# Patient Record
Sex: Male | Born: 1985
Health system: Southern US, Community
[De-identification: ages and names within clinical notes are randomized; demographics above are authoritative.]

## PROBLEM LIST (undated history)

## (undated) DIAGNOSIS — IMO0001 Reserved for inherently not codable concepts without codable children: Secondary | ICD-10-CM

## (undated) DIAGNOSIS — S8290XA Unspecified fracture of unspecified lower leg, initial encounter for closed fracture: Secondary | ICD-10-CM

## (undated) HISTORY — PX: OTHER SURGICAL HISTORY: SHX169

## (undated) HISTORY — DX: Reserved for inherently not codable concepts without codable children: IMO0001

## (undated) HISTORY — DX: Unspecified fracture of unspecified lower leg, initial encounter for closed fracture: S82.90XA

---

## 2014-07-10 LAB — LIPID PANEL
CHOLESTEROL: 130 mg/dL (ref 0–200)
HDL: 68 mg/dL (ref 35–70)

## 2014-07-10 LAB — BASIC METABOLIC PANEL: GLUCOSE: 77 mg/dL

## 2014-08-28 ENCOUNTER — Ambulatory Visit (INDEPENDENT_AMBULATORY_CARE_PROVIDER_SITE_OTHER): Payer: BLUE CROSS/BLUE SHIELD | Admitting: Family Medicine

## 2014-08-28 ENCOUNTER — Encounter: Payer: Self-pay | Admitting: Family Medicine

## 2014-08-28 VITALS — BP 128/88 | Temp 99.5°F | Ht 76.0 in | Wt 198.0 lb

## 2014-08-28 DIAGNOSIS — Z0000 Encounter for general adult medical examination without abnormal findings: Secondary | ICD-10-CM

## 2014-08-28 DIAGNOSIS — Z23 Encounter for immunization: Secondary | ICD-10-CM

## 2014-08-28 DIAGNOSIS — Z808 Family history of malignant neoplasm of other organs or systems: Secondary | ICD-10-CM | POA: Insufficient documentation

## 2014-08-28 LAB — CBC
HCT: 45.7 % (ref 39.0–52.0)
Hemoglobin: 15.3 g/dL (ref 13.0–17.0)
MCHC: 33.4 g/dL (ref 30.0–36.0)
MCV: 95.4 fl (ref 78.0–100.0)
PLATELETS: 212 10*3/uL (ref 150.0–400.0)
RBC: 4.78 Mil/uL (ref 4.22–5.81)
RDW: 13.9 % (ref 11.5–15.5)
WBC: 6 10*3/uL (ref 4.0–10.5)

## 2014-08-28 LAB — COMPREHENSIVE METABOLIC PANEL
ALBUMIN: 4.8 g/dL (ref 3.5–5.2)
ALK PHOS: 29 U/L — AB (ref 39–117)
ALT: 12 U/L (ref 0–53)
AST: 16 U/L (ref 0–37)
BILIRUBIN TOTAL: 0.8 mg/dL (ref 0.2–1.2)
BUN: 9 mg/dL (ref 6–23)
CALCIUM: 9.8 mg/dL (ref 8.4–10.5)
CO2: 32 mEq/L (ref 19–32)
CREATININE: 0.81 mg/dL (ref 0.40–1.50)
Chloride: 104 mEq/L (ref 96–112)
GFR: 118.61 mL/min (ref >60.00)
Glucose, Bld: 96 mg/dL (ref 70–99)
Potassium: 4.4 mEq/L (ref 3.5–5.1)
SODIUM: 140 meq/L (ref 135–145)
TOTAL PROTEIN: 7.3 g/dL (ref 6.0–8.3)

## 2014-08-28 LAB — POCT URINALYSIS DIPSTICK
BILIRUBIN UA: NEGATIVE
Glucose, UA: NEGATIVE
KETONES UA: NEGATIVE
LEUKOCYTES UA: NEGATIVE
Nitrite, UA: NEGATIVE
PH UA: 8.5
Protein, UA: NEGATIVE
RBC UA: NEGATIVE
SPEC GRAV UA: 1.015
Urobilinogen, UA: 0.2

## 2014-08-28 LAB — TSH: TSH: 1.43 u[IU]/mL (ref 0.35–4.50)

## 2014-08-28 NOTE — Progress Notes (Signed)
Tana Conch, MD Phone: [phone]  Subjective:  Patient presents today to establish care. Yearly physical through work but no other care.  Chief complaint-noted.   70 week old at home. No immunizations other than flu shot at work. Needs Tdap.   BP at work 110/70. Waist circumference <35. Initially high here but resolved on repeat  Chol (lipids all normal range) and blood sugar (77).   Skin surgery center once a year full skin exam- melanoma in mother x 2. For 3 years.   Exercise regularly. Used to be 8 hours of sleep.   Eats pretty clean.   The following were reviewed and entered/updated in epic: Past Medical History  Diagnosis Date  . Healthy adult   . Leg fracture     L- age 82   Patient Active Problem List   Diagnosis Date Noted  . Family history of melanoma 08/28/2014    Priority: Low   Past Surgical History  Procedure Laterality Date  . None      Family History  Problem Relation Age of Onset  . Melanoma Mother     x2    Medications- reviewed and updated. No meds.   Allergies-reviewed and updated No Known Allergies  Social History   Social History  . Marital Status: Married    Spouse Name: N/A  . Number of Children: N/A  . Years of Education: N/A   Social History Main Topics  . Smoking status: Never Smoker   . Smokeless tobacco: Not on file  . Alcohol Use: 7.2 oz/week    12 Standard drinks or equivalent per week  . Drug Use: No  . Sexual Activity: Not on file   Other Topics Concern  . Not on file   Social History Narrative   Family: Married. 08/02/14 son Simonne Come.       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 Oropharynx normal. TM normal. Eyes: sclera and lids normal, PERRLA Neck: no thyromegaly, no cervical lymphadenopathy CV: RRR no murmurs rubs or gallops Lungs: CTAB no crackles, wheeze, rhonchi Abdomen: soft/nontender/nondistended/normal bowel sounds. No rebound or guarding.  Ext: no edema, 2+ PT pulses Skin: warm, dry, no rash, fair skin, many freckles Neuro: 5/5 strength in upper and lower extremities, normal gait, normal reflexes   Assessment/Plan:  29 y.o. male presenting for annual physical.  Health Maintenance counseling: 1. Anticipatory guidance: Patient counseled regarding regular dental exams, wearing seatbelts, wear sunscreen. Dermatology yearly due to family history. 2. Risk factor reduction:  Advised patient of need for regular exercise and diet rich and fruits and vegetables to reduce risk of heart attack and stroke.  3. Immunizations/screenings/ancillary studies Health Maintenance Due  Topic Date Due  . HIV Screening - monogamous - 1 partner only ever 04/06/1999  . TETANUS/TDAP - today 04/06/2003  . INFLUENZA VACCINE - through work 08/13/2014  4. Testicular cancer- recommend monthly self exams  Hep A if travels  nonfasting- no concerns with completed labs, already  had lipids through work and fasting CBG Results for orders placed or performed in visit on 08/28/14 (from the past 24 hour(s))  CBC     Status: None   Collection Time: 08/28/14 12:28 PM  Result Value Ref Range   WBC 6.0 4.0 - 10.5 K/uL   RBC 4.78 4.22 - 5.81 Mil/uL   Platelets 212.0 150.0 - 400.0 K/uL   Hemoglobin 15.3 13.0 - 17.0 g/dL   HCT 16.1 09.6 - 04.5 %   MCV 95.4 78.0 - 100.0 fl   MCHC 33.4 30.0 - 36.0 g/dL   RDW 40.9 81.1 - 91.4 %  Comprehensive metabolic panel     Status: Abnormal   Collection Time: 08/28/14 12:28 PM  Result Value Ref Range   Sodium 140  135 - 145 mEq/L   Potassium 4.4 3.5 - 5.1 mEq/L   Chloride 104 96 - 112 mEq/L   CO2 32 19 - 32 mEq/L   Glucose, Bld 96 70 - 99 mg/dL   BUN 9 6 - 23 mg/dL   Creatinine, Ser 7.82 0.40 - 1.50 mg/dL   Total Bilirubin 0.8 0.2 - 1.2 mg/dL   Alkaline Phosphatase 29 (L) 39 - 117 U/L   AST 16 0 - 37 U/L   ALT 12 0 - 53 U/L   Total Protein 7.3 6.0 - 8.3 g/dL   Albumin 4.8 3.5 - 5.2 g/dL   Calcium 9.8 8.4 - 95.6 mg/dL   GFR 213.08 >65.78 mL/min  TSH     Status: None   Collection Time: 08/28/14 12:28 PM  Result Value Ref Range   TSH 1.43 0.35 - 4.50 uIU/mL  POCT urinalysis dipstick     Status: None   Collection Time: 08/28/14  2:07 PM  Result Value Ref Range   Color, UA yellow    Clarity, UA clear    Glucose, UA n    Bilirubin, UA n    Ketones, UA n    Spec Grav, UA 1.015    Blood, UA n    pH, UA 8.5    Protein, UA n    Urobilinogen, UA 0.2    Nitrite, UA n    Leukocytes, UA Negative Negative   Orders Placed This Encounter  Procedures  . Tdap vaccine greater than or equal to 7yo IM

## 2014-08-28 NOTE — Patient Instructions (Signed)
Medication Instructions:  None needed  Other Instructions:  Great job taking care of yourself!   Labwork: In lab on right on your way out  Testing/Procedures/Immunizations: Tdap today (coveres tetanus and pertussis-whooping cough)  Would need Hep A for certain travel destinations  Follow-Up (all visit scheduling, rescheduling, cancellations including labs should be scheduled at front desk): 1 year for physical  Travel medicine [phone] if going out of country

## 2014-12-24 ENCOUNTER — Encounter: Payer: Self-pay | Admitting: Family Medicine

## 2014-12-24 ENCOUNTER — Ambulatory Visit (INDEPENDENT_AMBULATORY_CARE_PROVIDER_SITE_OTHER): Payer: BLUE CROSS/BLUE SHIELD | Admitting: Family Medicine

## 2014-12-24 VITALS — BP 122/80 | Temp 98.6°F | Wt 204.0 lb

## 2014-12-24 DIAGNOSIS — L03011 Cellulitis of right finger: Secondary | ICD-10-CM | POA: Diagnosis not present

## 2014-12-24 MED ORDER — AMOXICILLIN-POT CLAVULANATE 875-125 MG PO TABS: 1.0000 | ORAL_TABLET | Freq: Two times a day (BID) | ORAL | Status: DC

## 2014-12-24 NOTE — Progress Notes (Signed)
Dave ConchStephen Tanelle Lanzo, MD  Subjective:  Dave Pena is a 29 y.o. year old very pleasant male patient who presents for/with See problem oriented charting ROS- no fever, chills. Does have expanding redness and swelling on right hand pointer finger. No nausea/vomiting  Past Medical History-  Patient Active Problem List   Diagnosis Date Noted  . Family history of melanoma 08/28/2014    Priority: Low    Medications- reviewed and updated, none  Objective: BP 122/80 mmHg  Temp(Src) 98.6 F (37 C)  Wt 204 lb (92.534 kg) Gen: NAD, resting comfortably CV: RRR no murmurs rubs or gallops Lungs: CTAB no crackles, wheeze, rhonchi Ext: no edema except localized to hand On palmar side of pointer finger 1 cm puncture wound now healing. Finger is significantly swollen and noted to have erythema that extends onto other side of hand as well as past 2nd MCP joint. Picture noted below  Skin: warm, dry, no rash or erythema except on right hand Neuro: intact distal sensation. 4/5 strength with flexing pointer finger due to pain       Assessment/Plan:  Right hand Cellulitis  S: Patient was playing with his dog on Friday night and they both went running for a ball and his hand and dog's teeth got to ball about the same time. Puncture wound was created on palmar side of 2nd finger. Patient states that it was swollen and mildly painful next day but what concerned him was continued progression of redness and swelling through Sunday and now today. He is up to date on tetanus and had this 08/28/14. Dog is up to date on immunizations and in fact dog showed no aggression and was "apologetic" essentially after incident A/P: Appears to have had decent puncture here. May have some tendon damage from puncture in flexors but likely weakness just due to swelling. No concern for fracture. For cellulitis from dog tooth- will use augmentin for 7 days with Return precautions advised.   Meds ordered this encounter    Medications  . amoxicillin-clavulanate (AUGMENTIN) 875-125 MG tablet    Sig: Take 1 tablet by mouth 2 (two) times daily.    Dispense:  14 tablet    Refill:  0

## 2014-12-24 NOTE — Patient Instructions (Signed)
Cellulitis (skin infection) due to dog bite Treat with augmentin for 7 days  Elevate, ice area for swelling portion  Follow up if fevers, expanding redness, no improvement with course of antibiotics

## 2015-06-20 ENCOUNTER — Ambulatory Visit (INDEPENDENT_AMBULATORY_CARE_PROVIDER_SITE_OTHER): Payer: BLUE CROSS/BLUE SHIELD | Admitting: Family Medicine

## 2015-06-20 ENCOUNTER — Encounter: Payer: Self-pay | Admitting: Family Medicine

## 2015-06-20 VITALS — BP 116/88 | HR 83 | Temp 98.1°F | Ht 76.0 in | Wt 200.0 lb

## 2015-06-20 DIAGNOSIS — R42 Dizziness and giddiness: Secondary | ICD-10-CM

## 2015-06-20 DIAGNOSIS — R0789 Other chest pain: Secondary | ICD-10-CM

## 2015-06-20 DIAGNOSIS — K219 Gastro-esophageal reflux disease without esophagitis: Secondary | ICD-10-CM | POA: Diagnosis not present

## 2015-06-20 NOTE — Progress Notes (Signed)
Pre visit review using our clinic review tool, if applicable. No additional management support is needed unless otherwise documented below in the visit note. 

## 2015-06-20 NOTE — Progress Notes (Signed)
Subjective:  Dave Pena is a 30 y.o. year old very pleasant male patient who presents for/with See problem oriented charting ROS- see any ROS included in HPI as well.   Past Medical History- nonsmoker Patient Active Problem List   Diagnosis Date Noted  . Family history of melanoma 08/28/2014    Priority: Low   Medications- reviewed and updated, no medications  Objective: BP 116/88 mmHg  Pulse 83  Temp(Src) 98.1 F (36.7 C) (Oral)  Ht 6\' 4"  (1.93 m)  Wt 200 lb (90.719 kg)  BMI 24.35 kg/m2  SpO2 98% Gen: NAD, resting comfortably CV: RRR no murmurs rubs or gallops Lungs: CTAB no crackles, wheeze, rhonchi Abdomen: soft/nontender/nondistended/normal bowel sounds.  Ext: no edema Skin: warm, dry Neuro: CN II-XII intact, sensation and reflexes normal throughout, 5/5 muscle strength in bilateral upper and lower extremities. Normal finger to nose. Normal rapid alternating movements. No pronator drift. Normal romberg. Normal gait.   Assessment/Plan:  Chest tightness Dizziness S:since Saturday has been feeling "off". Feels a "faint tightness" in chest. Central chest- states not painful- "just feels off". Very gassy and having deep burps. Normal day today but then at work felt lightheaded. Deep breaths do not make a difference. Episode at work lasted about an hour 10am to 11 am today. Went out to walk and felt worse. Went home and rested, ate some food made it better.   Month of June decided not to drink any alcohol. Drinking carobonated water in place.  Has 6811 month old and [place] very soon. Work has been ok.   ROS- no shortness of breath, diaphoresis, left arm or neck pain. No dizziness until today. No facial or extremity weakness. No slurred words or trouble swallowing. no blurry vision or double vision. No paresthesias. No confusion or word finding difficulties.   A/P: 30 year old active, athletic male  With no cardiac risk factors with  no family history of cardiac or neurological issues presents with chest tightness for several days associated with gas and increase in carbonated beverages as well as dizzy episode for an hour at work earlier today. Discussed likelihood of cardiac cause low- offered EKG which was declined. Discussed likely reflux trigger with increased carbonated beverages and gassiness- he is going to reduce drinks, try tums. If not work- try PPI for a few weeks. If worsening symptoms return to care. Reassuring neuro exam in regards to dizziness. Doubt arhythmia for this. Suspect both issues may be worsened by recent stressor of having wife leave to go [place] for 2.5 weeks while caring for his 6111 month old son.   Strict Return precautions advised.   The duration of face-to-face time during this visit was 20 minutes. Greater than 50% of this time was spent in counseling, explanation of diagnosis, planning of further management, and/or coordination of care.    Tana ConchStephen Rydell Wiegel, MD

## 2015-06-20 NOTE — Patient Instructions (Signed)
Chest tightness/gassiness- think this has high probability of being reflux related. Would hold off on carbonated beverages and trial Tums when bothering you. If no better in a week, trial OTC prilosec 20mg .   For dizziness- I wonder if this could be stress related given preceeding chest tightness issues. Your neurological exam was reassuring and your heart had a nice regular rate and rhythm so my suspicion for arhythmia is very low  We considered EKG for both but ultimately opted against this with reassuring neurological, cardiac exam and overall healthy history and healthy family history. You will return for new or worsening or persistent symptoms and we will further investigate.

## 2016-06-22 ENCOUNTER — Encounter: Payer: Self-pay | Admitting: Family Medicine

## 2016-06-22 ENCOUNTER — Ambulatory Visit (INDEPENDENT_AMBULATORY_CARE_PROVIDER_SITE_OTHER): Payer: BLUE CROSS/BLUE SHIELD | Admitting: Family Medicine

## 2016-06-22 DIAGNOSIS — Z91038 Other insect allergy status: Secondary | ICD-10-CM | POA: Insufficient documentation

## 2016-06-22 MED ORDER — EPINEPHRINE 0.3 MG/0.3ML IJ SOAJ: 0.3000 mg | Freq: Once | INTRAMUSCULAR | 1 refills | Status: AC

## 2016-06-22 NOTE — Patient Instructions (Addendum)
Significant allergic reaction to insect bite/sting (unknown insect)  Suspect calf pain will slowly resolve within a week or two. No signs of bacterial infection but watch out for worsening pain, expanding redness near site. Glad you are up to date on tetanus shot already Immunization History  Administered Date(s) Administered  . Influenza-Unspecified 11/27/2014  . Tdap 08/28/2014   I want you to have epipen on hand in case you do not have quick response to benadryl if ever have similar reactoin. Would also get some adult benadryl on hand for home.

## 2016-06-22 NOTE — Assessment & Plan Note (Signed)
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 Felt pain in left calf after getting a pillow out from porch- sharp pain back of left calf and swatted insect away - but didn't see specific insect. History of bee stings before and no reaction.   Within an hour-Noted bottom lip swelling. Rash on back arms and hips. Took 3 teaspoons of childrens benadryl around 12. Saw urgent care around 1 PM- but never was seen because things were improving and he wen thome  Mild calf pain this AM- improving as day has gone in.  A/P: Lip swelling after insect bite or sting (unknown insect). Good response to benadryl- advised him to have adult benadryl on hand. Given angioedema- will have him keep an epi pen on hand (sent to pharmacy).   He asks about possible bacterial infection but no pain with palpation of calf and no visible issue on skin- advised return precautions only but would not advise antibiotic at this time

## 2016-06-22 NOTE — Progress Notes (Signed)
Subjective:  Dave Pena is a 31 y.o. year old very pleasant male patient who presents for/with See problem oriented charting ROS- No chest pain or shortness of breath. No headache or blurry vision. Did have lip swelling- but as noted never short of breath   Past Medical History-  Patient Active Problem List   Diagnosis Date Noted  . Family history of melanoma 08/28/2014    Priority: Low  . Allergic to insect stings 06/22/2016    Medications- reviewed and updated, none  Objective: BP 122/82 (BP Location: Left Arm, Patient Position: Sitting, Cuff Size: Large)   Pulse 81   Temp 98.2 F (36.8 C) (Oral)   Ht 6\' 4"  (1.93 m)   Wt 204 lb 12.8 oz (92.9 kg)   SpO2 99%   BMI 24.93 kg/m  Gen: NAD, resting comfortably CV: RRR Lungs: CTAB no crackles, wheeze, rhonchi  Ext: no edema, left calf without pain to palpation- no skin chnages Skin: warm, dry, no rash  Assessment/Plan:  Allergic to insect stings S:  doing outdoor work yesterday. Felt pain in left calf after getting a pillow out from porch- sharp pain back of left calf and swatted insect away - but didn't see specific insect. History of bee stings before and no reaction.   Within an hour-Noted bottom lip swelling. Rash on back arms and hips. Took 3 teaspoons of childrens benadryl around 12. Saw urgent care around 1 PM- but never was seen because things were improving and he wen thome  Mild calf pain this AM- improving as day has gone in.  A/P: Lip swelling after insect bite or sting (unknown insect). Good response to benadryl- advised him to have adult benadryl on hand. Given angioedema- will have him keep an epi pen on hand (sent to pharmacy).   He asks about possible bacterial infection but no pain with palpation of calf and no visible issue on skin- advised return precautions only but would not advise antibiotic at this time   Meds ordered this encounter  Medications  . EPINEPHrine (EPIPEN 2-PAK) 0.3 mg/0.3 mL IJ SOAJ  injection    Sig: Inject 0.3 mLs (0.3 mg total) into the muscle once.    Dispense:  2 Device    Refill:  1    Return precautions advised.  Prn follow up for this acute issue Tana ConchStephen Kyree Fedorko, MD

## 2016-11-20 ENCOUNTER — Ambulatory Visit: Payer: BLUE CROSS/BLUE SHIELD | Admitting: Sports Medicine

## 2017-02-19 ENCOUNTER — Ambulatory Visit: Payer: BLUE CROSS/BLUE SHIELD | Admitting: Sports Medicine

## 2017-02-19 ENCOUNTER — Encounter: Payer: Self-pay | Admitting: Sports Medicine

## 2017-02-19 ENCOUNTER — Ambulatory Visit (INDEPENDENT_AMBULATORY_CARE_PROVIDER_SITE_OTHER): Payer: BLUE CROSS/BLUE SHIELD

## 2017-02-19 VITALS — BP 120/80 | HR 81 | Ht 76.0 in | Wt 207.2 lb

## 2017-02-19 DIAGNOSIS — S8992XA Unspecified injury of left lower leg, initial encounter: Secondary | ICD-10-CM | POA: Diagnosis not present

## 2017-02-19 DIAGNOSIS — R29898 Other symptoms and signs involving the musculoskeletal system: Secondary | ICD-10-CM

## 2017-02-19 DIAGNOSIS — M25562 Pain in left knee: Secondary | ICD-10-CM | POA: Diagnosis not present

## 2017-02-19 DIAGNOSIS — K219 Gastro-esophageal reflux disease without esophagitis: Secondary | ICD-10-CM | POA: Diagnosis not present

## 2017-02-19 MED ORDER — IBUPROFEN-FAMOTIDINE 800-26.6 MG PO TABS: 1.0000 | ORAL_TABLET | Freq: Three times a day (TID) | ORAL | 0 refills | Status: DC | PRN

## 2017-02-19 MED ORDER — IBUPROFEN-FAMOTIDINE 800-26.6 MG PO TABS: 1.0000 | ORAL_TABLET | Freq: Three times a day (TID) | ORAL | 2 refills | Status: DC | PRN

## 2017-02-19 NOTE — Progress Notes (Signed)
[facility]. [facility] [phone]  DANTHONY KENDRIX - 32 y.o. male MRN [id number]  Date of birth: 05/24/1985  Visit Date: 02/19/2017  PCP: Shelva Majestic, MD   Referred by: Shelva Majestic, MD   Scribe for today's visit: Stevenson Clinch, CMA     SUBJECTIVE:  Dave Pena is here for New Patient (Initial Visit) (LT knee pain)  His LT knee pain symptoms INITIALLY: Began in August and started after going on a trail run.  Described as mild stiffness/tightness, nonradiating Worsened with walking after sitting for prolonged periods of time. Pain is also worse after running.  Improved with activity. Additional associated symptoms include: Pain is on the medial aspect of the knee. He has not noticed any swelling. He denies clicking or popping in the knee. He denies pain in ankles, feet hips.     At this time symptoms are worsening compared to onset  He has tried icing and elevating his knee with some relief. He has taken IBU with some relief.    ROS Denies night time disturbances. Denies fevers, chills, or night sweats. Denies unexplained weight loss. Denies personal history of cancer. Denies changes in bowel or bladder habits. Denies recent unreported falls. Denies new or worsening dyspnea or wheezing. Denies lower extremity edema     HISTORY & PERTINENT PRIOR DATA:  Prior History reviewed and updated per electronic medical record.  Significant history, findings, studies and interim changes include:  reports that  has never smoked. he has never used smokeless tobacco. No results for input(s): HGBA1C, LABURIC, CREATINE in the last 8760 hours. No specialty comments available. No problems updated.  OBJECTIVE:  VS:  HT:6\' 4"  (193 cm)   WT:207 lb 3.2 oz (94 kg)  BMI:25.23    BP:120/80  HR:81bpm  TEMP: ( )  RESP:99 %   PHYSICAL EXAM: Constitutional: WDWN, Non-toxic appearing. Psychiatric: Alert &  appropriately interactive.  Not depressed or anxious appearing. Respiratory: No increased work of breathing.  Trachea Midline Eyes: Pupils are equal.  EOM intact without nystagmus.  No scleral icterus  NEUROVASCULAR exam: No clubbing or cyanosis appreciated No significant venous stasis changes Capillary Refill: normal, less than 2 seconds   Left knee is overall well aligned.  He has poor definition of his VMO on the left compared to the right.  Positive J sign.  Ligamentously stable.  No pain with McMurray's.  No effusion or synovitis.  No significant lower extremity edema.   No additional findings.   ASSESSMENT & PLAN:   1. Left knee pain, unspecified chronicity   2. Gastroesophageal reflux disease without esophagitis   3. Weakness of left hip    PLAN: Symptoms are consistent with functional knee pain with possibly small medial meniscus bulging.  Discussed the importance of appropriate biomechanics and improved VMO recruitment.  Hip abduction strengthening reviewed per procedure note as well.  Follow-up in 6 weeks to ensure clinical improvement.  No problem-specific Assessment & Plan notes found for this encounter.   ++++++++++++++++++++++++++++++++++++++++++++ Orders & Meds: Orders Placed This Encounter  Procedures  . DG Knee AP/LAT W/Sunrise Left    Meds ordered this encounter  Medications  . Ibuprofen-Famotidine (DUEXIS) 800-26.6 MG TABS    Sig: Take 1 tablet by mouth 3 (three) times daily as needed. 1 tab po tid X 14 days then 1 tab po tid as needed    Dispense:  90 tablet    Refill:  2  Home Phone      [phone] Mobile          [phone]   . Ibuprofen-Famotidine (DUEXIS) 800-26.6 MG TABS    Sig: Take 1 tablet by mouth 3 (three) times daily as needed.    Dispense:  9 tablet    Refill:  0    ++++++++++++++++++++++++++++++++++++++++++++ Follow-up: No Follow-up on file.   Pertinent documentation may be included in additional procedure notes, imaging studies,  problem based documentation and patient instructions. Please see these sections of the encounter for additional information regarding this visit. CMA/ATC served as Neurosurgeonscribe during this visit. History, Physical, and Plan performed by medical provider. Documentation and orders reviewed and attested to.      Andrena MewsMichael D Rigby, DO    [facility]

## 2017-02-19 NOTE — Procedures (Signed)
PROCEDURE NOTE: THERAPEUTIC EXERCISES (97110) 15 minutes spent for Therapeutic exercises as below and as referenced in the AVS. This included exercises focusing on stretching, strengthening, with significant focus on eccentric aspects.  Proper technique shown and discussed handout in great detail with ATC. All questions were discussed and answered.   Long term goals include an improvement in range of motion, strength, endurance as well as avoiding reinjury. Frequency of visits is one time as determined during today's  office visit. Frequency of exercises to be performed is as per handout.  EXERCISES REVIEWED:  Hip abduction exercises  VMO strengthening  Goodman exercises

## 2017-02-19 NOTE — Patient Instructions (Addendum)
Also check out [employer]"Foundation Training" which is a program developed by Dr. Myles LippsEric Goodman.   There are links to a couple of his YouTube Videos below and I would like to see performing one of his videos 5-6 days per week.    A good intro video is: "Independence from Pain 7-minute Video" - [email or web address]/[email or web address]   His more advanced video is: "Powerful Posture and Pain Relief: 12 minutes of Foundation Training" - https://youtu.be/[email or web address]  Do not try to attempt this entire video when first beginning.    Try breaking of each exercise that he goes into shorter segments.  Otherwise if they perform an exercise for 45 seconds, start with 15 seconds and rest and then resume when they begin the new activity.    If you work your way up to doing this 12 minute video, I expect you will see significant improvements in your pain.  If you enjoy his videos and would like to find out more you can look on his website: [email or web address].  He has a workout streaming option as well as a DVD set available for purchase.  Amazon has the best price for his DVDs.    Please perform the exercise program that we have prepared for you and gone over in detail on a daily basis.  In addition to the handout you were provided you can access your program through: [email or web address]   Your unique program code is: [email or web address]   [email or web address]   [facility] instructions for [facility], [facility] and Vimovo:  Your prescription will be filled through a mail order pharmacy.  It is typically [facility] but may vary depending on where you live.  You will receive a phone call from them which will typically come from a [phone] phone number.  You must speak directly to them to have this medication filled.  When the pharmacy calls, they will need your mailing address (for overnight shipment of the medication) andy they will need payment information if you have a copay (typically no more than $10). If you have not heard from them 2-3 days  after your appointment with Dr. Berline Choughigby, contact us at the office [phone]) or through MyChart so we can reach back out to the pharmacy.

## 2017-06-16 ENCOUNTER — Ambulatory Visit: Payer: BLUE CROSS/BLUE SHIELD | Admitting: Physician Assistant

## 2017-06-16 ENCOUNTER — Encounter: Payer: Self-pay | Admitting: Physician Assistant

## 2017-06-16 VITALS — BP 150/90 | HR 68 | Temp 98.2°F | Ht 76.0 in | Wt 202.5 lb

## 2017-06-16 DIAGNOSIS — B349 Viral infection, unspecified: Secondary | ICD-10-CM | POA: Diagnosis not present

## 2017-06-16 MED ORDER — AMOXICILLIN-POT CLAVULANATE 875-125 MG PO TABS: 1.0000 | ORAL_TABLET | Freq: Two times a day (BID) | ORAL | 0 refills | Status: DC

## 2017-06-16 NOTE — Patient Instructions (Signed)
It was great to see you!  You have a viral upper respiratory infection. Antibiotics are not needed for this.  Viral infections usually take 7-10 days to resolve.  The cough can last a few weeks to go away.  May alternate 500 mg ibuprofen with 200-400 mg tylenol every 6 hours. Push fluids. Consider taking daily antihistamine such as zyrtec, allegra, or claritin. Also consider using over the counter flonase -- once in am and pm.  Push fluids and get plenty of rest. Please return if you are not improving as expected, or if you have high fevers (>101.5) or difficulty swallowing or worsening productive cough.  Call clinic with questions.  I hope you start feeling better soon!

## 2017-06-16 NOTE — Progress Notes (Signed)
Dave Pena is a 32 y.o. male here for a new problem.  I acted as a Neurosurgeonscribe for Energy East CorporationSamantha Rosalynd Mcwright, PA-C Corky Mullonna Orphanos, LPN  History of Present Illness:   Chief Complaint  Patient presents with  . Sinus Problem    Sinus Problem  This is a new problem. Episode onset: Started on Monday. The problem has been gradually worsening since onset. There has been no fever. His pain is at a severity of 7/10. The pain is moderate. Associated symptoms include congestion (Nasal), coughing (at night), ear pain, headaches, neck pain, sinus pressure and a sore throat. Pertinent negatives include no chills, hoarse voice, shortness of breath, sneezing or swollen glands. Past treatments include saline sprays (Ibuprofen, Claritin yesterday). The treatment provided no relief.   Patient reports that his worst symptoms were yesterday, improved today.  He says that he feels fatigued and feels like he is dehydrated.  An episode of dizziness a few months ago right when the season change and symptoms resolved with rest.  Appetite is good.  No fevers.  Denies chest pain or shortness of breath.  Denies sick contacts, recent tick bites or travel.  Past Medical History:  Diagnosis Date  . Healthy adult   . Leg fracture    L- age 32     Social History   Socioeconomic History  . Marital status: Married    Spouse name: Not on file  . Number of children: Not on file  . Years of education: Not on file  . Highest education level: Not on file  Occupational History  . Not on file  Social Needs  . Financial resource strain: Not on file  . Food insecurity:    Worry: Not on file    Inability: Not on file  . Transportation needs:    Medical: Not on file    Non-medical: Not on file  Tobacco Use  . Smoking status: Never Smoker  . Smokeless tobacco: Never Used  Substance and Sexual Activity  . Alcohol use: Yes    Alcohol/week: 7.2 oz    Types: 12 Standard drinks or equivalent per week  . Drug use: No  . Sexual  activity: Not on file  Lifestyle  . Physical activity:    Days per week: Not on file    Minutes per session: Not on file  . Stress: Not on file  Relationships  . Social connections:    Talks on phone: Not on file    Gets together: Not on file    Attends religious service: Not on file    Active member of club or organization: Not on file    Attends meetings of clubs or organizations: Not on file    Relationship status: Not on file  . Intimate partner violence:    Fear of current or ex partner: Not on file    Emotionally abused: Not on file    Physically abused: Not on file    Forced sexual activity: Not on file  Other Topics Concern  . Not on file  Social History Narrative   Family: Married. 08/02/14 son Simonne ComeLeo.       Work: Engineer, manufacturing[employer]   Bachelors at [employer]   Started [employer]- played soccer there   [place]      Hobbies: active with soccer, runs with wife (up to 10 miles a week), mountain bike (about 10 miles a week)    Past Surgical History:  Procedure Laterality Date  . none  Family History  Problem Relation Age of Onset  . Melanoma Mother        x2    No Known Allergies  Current Medications:   Current Outpatient Medications:  .  ibuprofen (ADVIL,MOTRIN) 200 MG tablet, Take 400 mg by mouth as needed., Disp: , Rfl:  .  amoxicillin-clavulanate (AUGMENTIN) 875-125 MG tablet, Take 1 tablet by mouth 2 (two) times daily., Disp: 20 tablet, Rfl: 0   Review of Systems:   Review of Systems  Constitutional: Negative for chills.  HENT: Positive for congestion (Nasal), ear pain, sinus pressure and sore throat. Negative for hoarse voice and sneezing.   Respiratory: Positive for cough (at night). Negative for shortness of breath.   Musculoskeletal: Positive for neck pain.  Neurological: Positive for headaches.    Vitals:   Vitals:   06/16/17 1244  BP: (!) 150/90  Pulse: 68  Temp: 98.2 F (36.8 C)  TempSrc: Oral  SpO2: 97%  Weight: 202 lb 8 oz (91.9  kg)  Height: 6\' 4"  (1.93 m)     Body mass index is 24.65 kg/m.  Physical Exam:   Physical Exam  Constitutional: He appears well-developed. He is cooperative.  Non-toxic appearance. He does not have a sickly appearance. He does not appear ill. No distress.  HENT:  Head: Normocephalic and atraumatic.  Right Ear: External ear and ear canal normal.  Left Ear: External ear and ear canal normal.  Nose: Mucosal edema and rhinorrhea present. Right sinus exhibits maxillary sinus tenderness. Right sinus exhibits no frontal sinus tenderness. Left sinus exhibits maxillary sinus tenderness. Left sinus exhibits no frontal sinus tenderness.  Mouth/Throat: Uvula is midline and mucous membranes are normal. No posterior oropharyngeal edema or posterior oropharyngeal erythema. Tonsils are 1+ on the right. Tonsils are 1+ on the left. No tonsillar exudate.  Bilateral ears with cerumen impaction unable to visualize TMs  Eyes: Conjunctivae and lids are normal.  Neck: Trachea normal.  Cardiovascular: Normal rate, regular rhythm, S1 normal, S2 normal, normal heart sounds and normal pulses.  No LE edema  Pulmonary/Chest: Effort normal and breath sounds normal. He has no decreased breath sounds. He has no wheezes. He has no rhonchi. He has no rales.  Lymphadenopathy:    He has no cervical adenopathy.  Neurological: He is alert. He has normal strength. No cranial nerve deficit. GCS eye subscore is 4. GCS verbal subscore is 5. GCS motor subscore is 6.  Skin: Skin is warm, dry and intact.  Psychiatric: He has a normal mood and affect. His speech is normal and behavior is normal.  Nursing note and vitals reviewed.   Assessment and Plan:    Dave Pena was seen today for sinus problem.  Diagnoses and all orders for this visit:  Viral illness  Other orders -     amoxicillin-clavulanate (AUGMENTIN) 875-125 MG tablet; Take 1 tablet by mouth 2 (two) times daily.   No red flags on exam.  Will initiate supportive  care, including alternating ibuprofen and Tylenol, Flonase, and an antihistamine.  I did provide a pocket prescription of Augmentin should his symptoms not improve or if worsen; we briefly entertained the idea of steroids however patient would like to avoid this at this time.  We also discussed doing labs however he also declined this at this time and will let us know if his symptoms do not improve.  And I discussed with him that should his symptoms not improve or he has any new symptoms to follow-up with [place]. Reviewed return  precautions including worsening fever, SOB, worsening cough or other concerns. Push fluids and rest. I recommend that patient follow-up if symptoms worsen or persist despite treatment x 7-10 days, sooner if needed.   . Reviewed expectations re: course of current medical issues. . Discussed self-management of symptoms. . Outlined signs and symptoms indicating need for more acute intervention. . Patient verbalized understanding and all questions were answered. . See orders for this visit as documented in the electronic medical record. . Patient received an After-Visit Summary.  CMA or LPN served as scribe during this visit. History, Physical, and Plan performed by medical provider. Documentation and orders reviewed and attested to.   Jarold Motto, PA-C

## 2017-06-28 ENCOUNTER — Encounter: Payer: Self-pay | Admitting: Family Medicine

## 2017-06-28 ENCOUNTER — Ambulatory Visit: Payer: BLUE CROSS/BLUE SHIELD | Admitting: Family Medicine

## 2017-06-28 VITALS — BP 136/76 | HR 99 | Temp 97.8°F | Ht 76.0 in | Wt 199.0 lb

## 2017-06-28 DIAGNOSIS — R519 Headache, unspecified: Secondary | ICD-10-CM

## 2017-06-28 DIAGNOSIS — R51 Headache: Secondary | ICD-10-CM

## 2017-06-28 MED ORDER — KETOROLAC TROMETHAMINE 60 MG/2ML IM SOLN
60.0000 mg | Freq: Once | INTRAMUSCULAR | Status: AC
  Administered 2017-06-28: 60 mg via INTRAMUSCULAR

## 2017-06-28 MED ORDER — PREDNISONE 20 MG PO TABS: ORAL_TABLET | ORAL | 0 refills | Status: DC

## 2017-06-28 NOTE — Patient Instructions (Addendum)
Toradol 60mg - strong ibuprofen injection today  Start prednisone today  This would cover you for several things- inflammation in sinuses (you have been adequately treated for bacterial portion), migraine headaches, tension headaches.   See us back when you return if not feeling better- if it would give you some peace of mind could go ahead and schedule this before you leave for about 10 days  If you were to have worsening headaches or worsening dizziness/off balanced feelings see me back sooner.

## 2017-06-28 NOTE — Progress Notes (Signed)
Subjective:  Dave Pena is a 32 y.o. year old very pleasant male patient who presents for/with See problem oriented charting ROS-no chest pain or shortness of breath reported.  No blurry vision reported.  Does feel lightheaded at times.  No syncope.  Past Medical History-  Patient Active Problem List   Diagnosis Date Noted  . Family history of melanoma 08/28/2014    Priority: Low  . Allergic to insect stings 06/22/2016    Medications- reviewed and updated Current Outpatient Medications  Medication Sig Dispense Refill  . ibuprofen (ADVIL,MOTRIN) 200 MG tablet Take 400 mg by mouth as needed.    . predniSONE (DELTASONE) 20 MG tablet Take 2 pills for 3 days, 1 pill for 4 days 10 tablet 0   No current facility-administered medications for this visit.     Objective: BP 136/76 (BP Location: Left Arm, Patient Position: Sitting, Cuff Size: Large)   Pulse 99   Temp 97.8 F (36.6 C) (Oral)   Ht 6\' 4"  (1.93 m)   Wt 199 lb (90.3 kg)   SpO2 100%   BMI 24.22 kg/m  Gen: NAD, resting comfortably CV: RRR no murmurs rubs or gallops Lungs: CTAB no crackles, wheeze, rhonchi Abdomen: soft/nontender/nondistended/normal bowel sounds. No rebound or guarding.  Ext: no edema Skin: warm, dry Neuro: CN II-XII intact, sensation and reflexes normal throughout, 5/5 muscle strength in bilateral upper and lower extremities. Normal finger to nose. Normal rapid alternating movements. No pronator drift. Normal romberg. Normal gait.   Assessment/Plan:   New onset headache - Plan: CBC with Differential/Platelet, Comprehensive metabolic panel, ketorolac (TORADOL) injection 60 mg S: came in June 5th with sinus pressure and felt congested. A few weeks prior to that had some significant allergies- felt better next day. June 3rd had a bad headache- wondered if it was low blood sugar- soda didn't help. Lasted into next day and then came in on 5th.   He has now had a headache for full 2 weeks. Pain 7-8/10 can  get up to a 9. Pressure is behind both eyes and wont go away. Feels the worst when he wakes up in AM though not waking him from sleep. Slightly low appetite- still able to eat. Feels somewhat unstable- Concerned he could pass out - feels lightheaded but denies feeling imminently like he would pass out. Denies nausea- does have some tightness in neck and shoulders  Took ibuprofen for 4 days every 6 hours or so but didn't improve the headache. Stopped before he started the antibiotics. Doing flonase once in Am and once at night. Claritin making him less drowsy than zyrtec- does take in AM.   Started augmentin on the 10th- he is on day #8 today. Has not noted improvement in headaches though denies congestion at this point. This morning with loose stool. Had some chills and felt a little shaky this Am. Has had some aches in shoulders.   Admits to stressors: Flying for first time with 77 year old son for a wedding and in the wedding- admits anxiety is very high.  A/P: top of differential likely tension headache. I think even if he had bacterial sinusitis it is cleared with 7 days of augmentin- we will stop this as not improving headache. Perhaps he had viral infection having a hard time clearing- will try prednisone to reduce inflammatoin. Could be migraine but not classic. Update cbc, cmp today. Doubt viral meningitis for primarily headache and lightheadedness. Without history of headaches, I do have a low threshold  for imaging if he fails to improve by follow up- CT or MRI possibly- would likely chat with one of my colleagues before ordering for their opinion.   From AVS:  " Toradol 60mg - strong ibuprofen injection today  Start prednisone today  This would cover you for several things- inflammation in sinuses (you have been adequately treated for bacterial portion), migraine headaches, tension headaches.   See us back when you return if not feeling better- if it would give you some peace of mind could go  ahead and schedule this before you leave for about 10 days  If you were to have worsening headaches or worsening dizziness/off balanced feelings see me back sooner.  " Consider imaging  Lab/Order associations: New onset headache - Plan: CBC with Differential/Platelet, Comprehensive metabolic panel, ketorolac (TORADOL) injection 60 mg  Meds ordered this encounter  Medications  . predniSONE (DELTASONE) 20 MG tablet    Sig: Take 2 pills for 3 days, 1 pill for 4 days    Dispense:  10 tablet    Refill:  0  . ketorolac (TORADOL) injection 60 mg   Return precautions advised.  Tana ConchStephen Hunter, MD

## 2017-06-29 LAB — CBC WITH DIFFERENTIAL/PLATELET
BASOS ABS: 0.1 10*3/uL (ref 0.0–0.1)
Basophils Relative: 0.9 % (ref 0.0–3.0)
EOS ABS: 0 10*3/uL (ref 0.0–0.7)
Eosinophils Relative: 0.6 % (ref 0.0–5.0)
HCT: 45.6 % (ref 39.0–52.0)
Hemoglobin: 15.5 g/dL (ref 13.0–17.0)
LYMPHS ABS: 2.4 10*3/uL (ref 0.7–4.0)
LYMPHS PCT: 33.3 % (ref 12.0–46.0)
MCHC: 33.9 g/dL (ref 30.0–36.0)
MCV: 95.4 fl (ref 78.0–100.0)
Monocytes Absolute: 0.6 10*3/uL (ref 0.1–1.0)
Monocytes Relative: 8.5 % (ref 3.0–12.0)
NEUTROS ABS: 4.1 10*3/uL (ref 1.4–7.7)
NEUTROS PCT: 56.7 % (ref 43.0–77.0)
PLATELETS: 204 10*3/uL (ref 150.0–400.0)
RBC: 4.78 Mil/uL (ref 4.22–5.81)
RDW: 12.9 % (ref 11.5–15.5)
WBC: 7.2 10*3/uL (ref 4.0–10.5)

## 2017-06-29 LAB — COMPREHENSIVE METABOLIC PANEL
ALT: 14 U/L (ref 0–53)
AST: 13 U/L (ref 0–37)
Albumin: 5 g/dL (ref 3.5–5.2)
Alkaline Phosphatase: 28 U/L — ABNORMAL LOW (ref 39–117)
BILIRUBIN TOTAL: 0.6 mg/dL (ref 0.2–1.2)
BUN: 9 mg/dL (ref 6–23)
CO2: 28 meq/L (ref 19–32)
CREATININE: 0.92 mg/dL (ref 0.40–1.50)
Calcium: 9.9 mg/dL (ref 8.4–10.5)
Chloride: 103 mEq/L (ref 96–112)
GFR: 100.56 mL/min (ref >60.00)
GLUCOSE: 94 mg/dL (ref 70–99)
Potassium: 4.3 mEq/L (ref 3.5–5.1)
SODIUM: 140 meq/L (ref 135–145)
Total Protein: 7.5 g/dL (ref 6.0–8.3)

## 2017-06-29 NOTE — Progress Notes (Signed)
Please check with him to see if headache has improved at all at this point and let me know.    Your CBC was normal (blood counts, infection fighting cells, platelets). Your CMET was stable/normal (kidney, liver, and electrolytes, blood sugar).

## 2017-07-02 ENCOUNTER — Telehealth: Payer: Self-pay | Admitting: Family Medicine

## 2017-07-02 NOTE — Telephone Encounter (Signed)
Spoke with patient and advised him of what Dr. Durene CalHunter had said. He verbalized understanding and stated he would be back in town on Monday and if he wasn't feeling better he would schedule for Tuesday

## 2017-07-02 NOTE — Telephone Encounter (Signed)
Copied from CRM [id number]. Topic: General - Other >> Jul 02, 2017  8:40 AM Tamela OddiHarris, Brenda J wrote: Reason for CRM: Patient called to request advice about his medication predniSONE (DELTASONE) 20 MG tablet.  He stated that he is having problems with the side effects of the medication and would like to know if he can stop taking it before his time is up.  CB#[phone].

## 2017-07-02 NOTE — Telephone Encounter (Signed)
Called and spoke with patient who states that the prednisone is making him feel worse. Blurred vision, sore throat, scrambled thoughts. He feels sinuses are better and headache has lessened.   Spoke with Dr. Durene CalHunter who advised that patient stop the prednisone. He states that if he feels worse to come back and see us next week.

## 2017-07-19 ENCOUNTER — Telehealth: Payer: Self-pay | Admitting: Family Medicine

## 2017-07-19 NOTE — Telephone Encounter (Signed)
Do you want him to be seen or just refer him to an ENT?

## 2017-07-19 NOTE — Telephone Encounter (Signed)
Tell him we can place a referral if he wants but I am also happy to see him and go over what they diagnosed him with and discuss potential steps prior to seeing ENT. Would also be great if he could have them send us a copy of their note.   Tana ConchStephen Makenzee Choudhry

## 2017-07-19 NOTE — Telephone Encounter (Signed)
Copied from CRM [id number]. Topic: Quick Communication - See Telephone Encounter >> Jul 19, 2017  8:14 AM Tamela OddiMartin, Don'Quashia, NT wrote: CRM for notification. See Telephone encounter for: 07/19/17. Patient called and states he went to an urgent care out of town and was diagnosed with acute Sinuitis . Patient states they recommended an ENT and he would like to know what Dr. Durene CalHunter recommends or should he come and see him instead. Please call patient CB# [phone]

## 2017-07-20 DIAGNOSIS — J329 Chronic sinusitis, unspecified: Secondary | ICD-10-CM | POA: Diagnosis not present

## 2017-07-20 DIAGNOSIS — J310 Chronic rhinitis: Secondary | ICD-10-CM | POA: Diagnosis not present

## 2017-07-20 NOTE — Telephone Encounter (Signed)
Called patient and he stated he has already scheduled an appointment with [facility] (Dr. Ezzard StandingNewman) for today at 1:15. He stated that the office didn't say anything about a referral needed. I asked him to have the [place] in [place] fax the office notes to us. I gave him our fax number and he sated he would work on it.

## 2017-07-29 DIAGNOSIS — J310 Chronic rhinitis: Secondary | ICD-10-CM | POA: Diagnosis not present

## 2017-08-04 ENCOUNTER — Encounter: Payer: Self-pay | Admitting: Family Medicine

## 2017-08-09 ENCOUNTER — Ambulatory Visit: Payer: BLUE CROSS/BLUE SHIELD | Admitting: Family Medicine

## 2017-08-09 ENCOUNTER — Encounter: Payer: Self-pay | Admitting: Family Medicine

## 2017-08-09 VITALS — BP 144/82 | HR 94 | Temp 98.0°F | Ht 76.0 in | Wt 188.0 lb

## 2017-08-09 DIAGNOSIS — F419 Anxiety disorder, unspecified: Secondary | ICD-10-CM | POA: Diagnosis not present

## 2017-08-09 LAB — TSH: TSH: 1.16 u[IU]/mL (ref 0.35–4.50)

## 2017-08-09 MED ORDER — HYDROXYZINE HCL 10 MG PO TABS: ORAL_TABLET | ORAL | 1 refills | Status: DC

## 2017-08-09 MED ORDER — FLUOXETINE HCL 20 MG PO TABS: 20.0000 mg | ORAL_TABLET | Freq: Every day | ORAL | 1 refills | Status: DC

## 2017-08-09 MED ORDER — IPRATROPIUM BROMIDE 0.03 % NA SOLN: 2.0000 | Freq: Two times a day (BID) | NASAL | 12 refills | Status: DC

## 2017-08-09 NOTE — Patient Instructions (Signed)
prozac daily for anxiety. Will take 4 weeks to get into your system, but after 2 weeks you should tell some difference. If really like, but  Need a little bit more you can increase to 40mg . Need to see Dr. Durene Cal in 1 month for re eval.   Hydroxyzine as needed for panic attacks. Can take up to 3x/day.   atrovent nasal spray for congestion.   Generalized Anxiety Disorder, Adult Generalized anxiety disorder (GAD) is a mental health disorder. People with this condition constantly worry about everyday events. Unlike normal anxiety, worry related to GAD is not triggered by a specific event. These worries also do not fade or get better with time. GAD interferes with life functions, including relationships, work, and school. GAD can vary from mild to severe. People with severe GAD can have intense waves of anxiety with physical symptoms (panic attacks). What are the causes? The exact cause of GAD is not known. What increases the risk? This condition is more likely to develop in:  Women.  People who have a family history of anxiety disorders.  People who are very shy.  People who experience very stressful life events, such as the death of a loved one.  People who have a very stressful family environment.  What are the signs or symptoms? People with GAD often worry excessively about many things in their lives, such as their health and family. They may also be overly concerned about:  Doing well at work.  Being on time.  Natural disasters.  Friendships.  Physical symptoms of GAD include:  Fatigue.  Muscle tension or having muscle twitches.  Trembling or feeling shaky.  Being easily startled.  Feeling like your heart is pounding or racing.  Feeling out of breath or like you cannot take a deep breath.  Having trouble falling asleep or staying asleep.  Sweating.  Nausea, diarrhea, or irritable bowel syndrome (IBS).  Headaches.  Trouble concentrating or remembering  facts.  Restlessness.  Irritability.  How is this diagnosed? Your health care provider can diagnose GAD based on your symptoms and medical history. You will also have a physical exam. The health care provider will ask specific questions about your symptoms, including how severe they are, when they started, and if they come and go. Your health care provider may ask you about your use of alcohol or drugs, including prescription medicines. Your health care provider may refer you to a mental health specialist for further evaluation. Your health care provider will do a thorough examination and may perform additional tests to rule out other possible causes of your symptoms. To be diagnosed with GAD, a person must have anxiety that:  Is out of his or her control.  Affects several different aspects of his or her life, such as work and relationships.  Causes distress that makes him or her unable to take part in normal activities.  Includes at least three physical symptoms of GAD, such as restlessness, fatigue, trouble concentrating, irritability, muscle tension, or sleep problems.  Before your health care provider can confirm a diagnosis of GAD, these symptoms must be present more days than they are not, and they must last for six months or longer. How is this treated? The following therapies are usually used to treat GAD:  Medicine. Antidepressant medicine is usually prescribed for long-term daily control. Antianxiety medicines may be added in severe cases, especially when panic attacks occur.  Talk therapy (psychotherapy). Certain types of talk therapy can be helpful in treating GAD by  providing support, education, and guidance. Options include: ? Cognitive behavioral therapy (CBT). People learn coping skills and techniques to ease their anxiety. They learn to identify unrealistic or negative thoughts and behaviors and to replace them with positive ones. ? Acceptance and commitment therapy (ACT).  This treatment teaches people how to be mindful as a way to cope with unwanted thoughts and feelings. ? Biofeedback. This process trains you to manage your body's response (physiological response) through breathing techniques and relaxation methods. You will work with a therapist while machines are used to monitor your physical symptoms.  Stress management techniques. These include yoga, meditation, and exercise.  A mental health specialist can help determine which treatment is best for you. Some people see improvement with one type of therapy. However, other people require a combination of therapies. Follow these instructions at home:  Take over-the-counter and prescription medicines only as told by your health care provider.  Try to maintain a normal routine.  Try to anticipate stressful situations and allow extra time to manage them.  Practice any stress management or self-calming techniques as taught by your health care provider.  Do not punish yourself for setbacks or for not making progress.  Try to recognize your accomplishments, even if they are small.  Keep all follow-up visits as told by your health care provider. This is important. Contact a health care provider if:  Your symptoms do not get better.  Your symptoms get worse.  You have signs of depression, such as: ? A persistently sad, cranky, or irritable mood. ? Loss of enjoyment in activities that used to bring you joy. ? Change in weight or eating. ? Changes in sleeping habits. ? Avoiding friends or family members. ? Loss of energy for normal tasks. ? Feelings of guilt or worthlessness. Get help right away if:  You have serious thoughts about hurting yourself or others. If you ever feel like you may hurt yourself or others, or have thoughts about taking your own life, get help right away. You can go to your nearest emergency department or call:  Your local emergency services (911 in the [place]  A suicide  crisis helpline, such as the National Suicide Prevention Lifeline at 762-296-64021-9108563082. This is open 24 hours a day.  Summary  Generalized anxiety disorder (GAD) is a mental health disorder that involves worry that is not triggered by a specific event.  People with GAD often worry excessively about many things in their lives, such as their health and family.  GAD may cause physical symptoms such as restlessness, trouble concentrating, sleep problems, frequent sweating, nausea, diarrhea, headaches, and trembling or muscle twitching.  A mental health specialist can help determine which treatment is best for you. Some people see improvement with one type of therapy. However, other people require a combination of therapies. This information is not intended to replace advice given to you by your health care provider. Make sure you discuss any questions you have with your health care provider. Document Released: 04/25/2012 Document Revised: 11/19/2015 Document Reviewed: 11/19/2015 Elsevier Interactive Patient Education  [employer]2018 [employer]

## 2017-08-09 NOTE — Progress Notes (Signed)
Patient: Dave Pena MRN: [id number] DOB: December 26, 1985 PCP: Shelva Majestic, MD     Subjective:  Chief Complaint  Patient presents with  . anxiety from multiple sinus infections?    HPI: The patient is a 32 y.o. male who presents today for anxiety and stress due to sinus issues. His sinus stuff started June 3rd. He has seen ENT twice. He was instructed to take sudafed and flonase as well as nasal saline sprays. Saw ENT and said next step would be CT of sinuses, but to see PCP for anxiety. This is visit is for anxiety.   His anxiety seemed to start in June and his wife states he seems to be obsessing over the sinusitis and now is obsessing over the anxiety. When asked if triggering event, his wife thinks it really started back in October when she had emergency surgery for an ectopic pregnancy. It has really gotten worse since June. He had a panic attack and has had a constant fear of feeling alone or impending doom. The anxiety is affecting his work and he can't even get through a day. His wife is pregnant with their second kid and he feels like he has a lot going on. He is so anxious that when his wife went on a trip he had to go spend a night at his mother in laws house. Panic attacks last less than 10 minutes. He gets sweaty, fast heart rate, fear. While he was in [place] he started to have thoughts of death and dying. It is almost suffocating him. He has no reckless behavior/suicide thoughts. He did talk to a therapist online while he was in [place]. Also had chat with MD. Family history of anxiety in his mother when she was going through cancer and a divorce. He is able to work. He is not sleeping good. He has been unable to exercise like he normally does. No si/hi/ah/vh. Lots of thoughts about him dying which only dominoes his anxiety.     Review of Systems  Constitutional: Positive for activity change, appetite change and fatigue. Negative for chills and fever.  HENT: Positive for  congestion, postnasal drip and sore throat. Negative for ear pain, sinus pressure and sinus pain.   Respiratory: Positive for cough and shortness of breath. Negative for wheezing.   Cardiovascular: Negative for chest pain.  Gastrointestinal: Negative for abdominal pain and nausea.  Neurological: Positive for headaches. Negative for dizziness.  Psychiatric/Behavioral: The patient is nervous/anxious.     Allergies Patient has No Known Allergies.  Past Medical History Patient  has a past medical history of Healthy adult and Leg fracture.  Surgical History Patient  has a past surgical history that includes none.  Family History Pateint's family history includes Melanoma in his mother.  Social History Patient  reports that he has never smoked. He has never used smokeless tobacco. He reports that he drinks about 7.2 oz of alcohol per week. He reports that he does not use drugs.    Objective: Vitals:   08/09/17 1132  BP: (!) 144/82  Pulse: 94  Temp: 98 F (36.7 C)  TempSrc: Oral  SpO2: 99%  Weight: 188 lb (85.3 kg)  Height: 6\' 4"  (1.93 m)    Body mass index is 22.88 kg/m.  Physical Exam  Constitutional: He is oriented to person, place, and time. He appears well-developed and well-nourished.  Eyes: Pupils are equal, round, and reactive to light. Conjunctivae and EOM are normal.  Neck: Normal range of motion. Neck  supple. No thyromegaly present.  Cardiovascular: Normal rate, regular rhythm and normal heart sounds.  Pulmonary/Chest: Effort normal and breath sounds normal.  Abdominal: Soft. Bowel sounds are normal.  Lymphadenopathy:    He has no cervical adenopathy.  Neurological: He is oriented to person, place, and time. No cranial nerve deficit.  Vitals reviewed.   GAD 7 : Generalized Anxiety Score 08/09/2017  Nervous, Anxious, on Edge 3  Control/stop worrying 3  Worry too much - different things 3  Trouble relaxing 3  Restless 3  Easily annoyed or irritable 1   Afraid - awful might happen 3  Total GAD 7 Score 19  Anxiety Difficulty Extremely difficult      Office Visit from 08/09/2017 in [facility]  PHQ-9 Total Score  10         Assessment/plan: 1. Anxiety We are going to start Prozac 20 mg daily as well as hydroxyzine as needed as needed for panic attacks or increased anxiety.  He has been having problems so he cannot exercise like he used to but understands that this is one of the best treatments he can do for anxiety.  Recommended that he try to start a stationary bike and elliptical or even swimming until his knee is better to run.  Also we discussed getting into counseling and recommendations were given to do this.  I think he would really benefit from counseling.  Im going to check a thyroid just to make sure that this is not contributing to his anxiety.  Side effects of Prozac discussed as well as increased suicidal ideation and if he has this they are to call 911 or go to the emergency room.  Also discussed that sometimes in the initial 2 weeks of starting an SSRI anxiety can be increased, so I want him to try to just power through and take the hydroxyzine as needed.  After 3 weeks if he can tell a change I am okay if he increases his Prozac to 40 mg but he must follow-up with his PCP in 1 month to see how he is doing.  Any issues before then please let us know.  Extensive counseling done in this appointment and over 45 minutes was spent with the patient. - TSH    Over 45 minutes spent in counseling/discussion with patient and his wife regarding diagnosis/history/treatment.  Return in about 1 month (around 09/09/2017) for anxiety .     Orland MustardAllison Kalijah Westfall, MD [facility]   08/09/2017

## 2017-08-16 ENCOUNTER — Ambulatory Visit: Payer: BLUE CROSS/BLUE SHIELD | Admitting: Sports Medicine

## 2017-08-18 ENCOUNTER — Encounter: Payer: Self-pay | Admitting: Sports Medicine

## 2017-08-18 ENCOUNTER — Ambulatory Visit: Payer: BLUE CROSS/BLUE SHIELD | Admitting: Sports Medicine

## 2017-08-18 ENCOUNTER — Ambulatory Visit: Payer: Self-pay

## 2017-08-18 VITALS — BP 130/86 | HR 95 | Ht 76.0 in | Wt 188.8 lb

## 2017-08-18 DIAGNOSIS — M25562 Pain in left knee: Secondary | ICD-10-CM

## 2017-08-18 DIAGNOSIS — G8929 Other chronic pain: Secondary | ICD-10-CM | POA: Diagnosis not present

## 2017-08-18 NOTE — Procedures (Signed)
PROCEDURE NOTE:  Ultrasound Guided: Injection: Left knee Images were obtained and interpreted by myself, Gaspar BiddingMichael Rigby, DO  Images have been saved and stored to PACS system. Images obtained on: GE S7 Ultrasound machine    ULTRASOUND FINDINGS:  Generalized synovitis, bulging medial meniscus.  DESCRIPTION OF PROCEDURE:  The patient's clinical condition is marked by substantial pain and/or significant functional disability. Other conservative therapy has not provided relief, is contraindicated, or not appropriate. There is a reasonable likelihood that injection will significantly improve the patient's pain and/or functional impairment.   After discussing the risks, benefits and expected outcomes of the injection and all questions were reviewed and answered, the patient wished to undergo the above named procedure.  Verbal consent was obtained.  The ultrasound was used to identify the target structure and adjacent neurovascular structures. The skin was then prepped in sterile fashion and the target structure was injected under direct visualization using sterile technique as below:  Single injection performed as below: PREP: Alcohol and Ethel Chloride APPROACH:superiolateral, single injection, 25g 1.5 in. INJECTATE: 2 cc 0.5% Marcaine and 2 cc 40mg /mL DepoMedrol ASPIRATE: None DRESSING: Band-Aid  Post procedural instructions including recommending icing and warning signs for infection were reviewed.    This procedure was well tolerated and there were no complications.   IMPRESSION: Succesful Ultrasound Guided: Injection

## 2017-08-18 NOTE — Patient Instructions (Addendum)
Can start exercising in 1-2 days and start riding your bike this weekend.  You can start jogging for 10-15 min in 2 weeks and should be feeling better by 4 weeks.   You had an injection today.  Things to be aware of after injection are listed below: . You may experience no significant improvement or even a slight worsening in your symptoms during the first 24 to 48 hours.  After that we expect your symptoms to improve gradually over the next 2 weeks for the medicine to have its maximal effect.  You should continue to have improvement out to 6 weeks after your injection. . Dr. Berline Choughigby recommends icing the site of the injection for 20 minutes  1-2 times the day of your injection . You may shower but no swimming, tub bath or Jacuzzi for 24 hours. . If your bandage falls off this does not need to be replaced.  It is appropriate to remove the bandage after 4 hours. . You may resume light activities as tolerated unless otherwise directed per Dr. Berline Choughigby during your visit  POSSIBLE STEROID SIDE EFFECTS:  Side effects from injectable steroids tend to be less than when taken orally however you may experience some of the symptoms listed below.  If experienced these should only last for a short period of time. Change in menstrual flow  Edema (swelling)  Increased appetite Skin flushing (redness)  Skin rash/acne  Thrush (oral) Yeast vaginitis    Increased sweating  Depression Increased blood glucose levels Cramping and leg/calf  Euphoria (feeling happy)  POSSIBLE PROCEDURE SIDE EFFECTS: The side effects of the injection are usually fairly minimal however if you may experience some of the following side effects that are usually self-limited and will is off on their own.  If you are concerned please feel free to call the office with questions:  Increased numbness or tingling  Nausea or vomiting  Swelling or bruising at the injection site   Please call our office if if you experience any of the following  symptoms over the next week as these can be signs of infection:   Fever greater than 100.53F  Significant swelling at the injection site  Significant redness or drainage from the injection site  If after 2 weeks you are continuing to have worsening symptoms please call our office to discuss what the next appropriate actions should be including the potential for a return office visit or other diagnostic testing.

## 2017-08-18 NOTE — Progress Notes (Signed)
[facility] [phone]  Dave Pena - 32 y.o. male MRN [id number]  Date of birth: 02/01/1985  Visit Date: 08/18/2017  PCP: Shelva Majestic, MD   Referred by: Shelva Majestic, MD  Scribe(s) for today's visit: Christoper Fabian, LAT, ATC  SUBJECTIVE:  Dave Pena is here for Follow-up (L knee pain) .    Notes from Initial Visit on 02/19/17: His L knee pain symptoms INITIALLY: Began in August and started after going on a trail run.  Described as mild stiffness/tightness, nonradiating Worsened with walking after sitting for prolonged periods of time. Pain is also worse after running.  Improved with activity. Additional associated symptoms include: Pain is on the medial aspect of the knee. He has not noticed any swelling. He denies clicking or popping in the knee. He denies pain in ankles, feet hips.     At this time symptoms are worsening compared to onset  He has tried icing and elevating his knee with some relief. He has taken IBU with some relief.   08/18/2017: Compared to the last office visit on 02/19/17, his previously described L knee pain symptoms are worsening w/ a change in his symptoms switching from more stiffness to more pain and clicking.  Pain is localized to his L medial knee.  He also now has more swelling.  He reports that he has started doing some biking in order to work out in some capacity and notes increased swelling after activity. Current symptoms are moderate & are nonradiating He is no longer taking the Duexis.  He was given a HEP and info on the [employer] at his last visit and did those exercises for approximately 2-3 months w/ no relief.   Pt states that he was recently diagnosed w/ anxiety and has been prescribed some new medications by Dr. Artis Flock.   REVIEW OF SYSTEMS: Denies night time disturbances. Denies fevers, chills, or night sweats. Denies unexplained weight  loss. Denies personal history of cancer. Denies changes in bowel or bladder habits. Denies recent unreported falls. Denies new or worsening dyspnea or wheezing. Reports headaches or dizziness.  Denies numbness, tingling or weakness  In the extremities.  Denies dizziness or presyncopal episodes Reports lower extremity edema - L knee   HISTORY:  Prior history reviewed and updated per electronic medical record.  Social History   Occupational History  . Not on file  Tobacco Use  . Smoking status: Never Smoker  . Smokeless tobacco: Never Used  Substance and Sexual Activity  . Alcohol use: Yes    Alcohol/week: 12.0 standard drinks    Types: 12 Standard drinks or equivalent per week  . Drug use: No  . Sexual activity: Not on file   Social History   Social History Narrative   Family: Married. 08/02/14 son Dave Pena.       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 Healthy adult   . Leg fracture    L- age 42   Past Surgical History:  Procedure Laterality Date  . none     family history includes Melanoma in his mother.  DATA OBTAINED & REVIEWED:  No results for  input(s): HGBA1C, LABURIC, CREATINE in the last 8760 hours. . X-rays of the left knee 02/19/2017 are normal . Ultrasound-guided injection of the left knee on 08/18/2017 with likely degenerative medial meniscus .   OBJECTIVE:  VS:  HT:6\' 4"  (193 cm)   WT:188 lb 12.8 oz (85.6 kg)  BMI:22.99    BP:130/86  HR:95bpm  TEMP: ( )  RESP:99 %   PHYSICAL EXAM: CONSTITUTIONAL: Well-developed, Well-nourished and In no acute distress PSYCHIATRIC: Alert & appropriately interactive. and Not depressed or anxious appearing. RESPIRATORY: No increased work of breathing and Trachea Midline EYES: Pupils are equal., EOM  intact without nystagmus. and No scleral icterus.  VASCULAR EXAM: Warm and well perfused NEURO: unremarkable  MSK Exam: Left knee  Well aligned, no significant deformity. No overlying skin changes. TTP over Medial and lateral joint lines more focally over the medial line   RANGE OF MOTION & STRENGTH  Full flexion and extension.   SPECIALITY TESTING:  Extensor mechanism is intact.  Pain with McMurray's and with Thessaly.  Ligamentously stable to anterior drawer and posterior drawer. Varus and valgus stressing causes mild pain across the medial joint line but is stable.    ASSESSMENT   1. Chronic pain of left knee     PLAN:  Pertinent additional documentation may be included in corresponding procedure notes, imaging studies, problem based documentation and patient instructions.  Procedures:  . US Guided Injection per procedure note  Medications:  No orders of the defined types were placed in this encounter.  Discussion/Instructions: No problem-specific Assessment & Plan notes found for this encounter.  . Likely medial meniscal tear that is degenerative in nature. . Discussed red flag symptoms that warrant earlier emergent evaluation and patient voices understanding. . Activity modifications and the importance of avoiding exacerbating activities (limiting pain to no more than a 4 / 10 during or following activity) recommended and discussed. Marland Kitchen. RICE (Rest, ICE, Compression, Elevation) principles reviewed with the patient.  Follow-up:  . Return in about 6 weeks (around 09/29/2017).   . If any lack of improvement consider:   . further diagnostic evaluation with MRI of the knee  .      CMA/ATC served as Neurosurgeonscribe during this visit. History, Physical, and Plan performed by medical provider. Documentation and orders reviewed and attested to.      Andrena MewsMichael D Rigby, DO    [facility]

## 2017-08-25 ENCOUNTER — Encounter: Payer: Self-pay | Admitting: Sports Medicine

## 2017-09-09 ENCOUNTER — Ambulatory Visit: Payer: BLUE CROSS/BLUE SHIELD | Admitting: Family Medicine

## 2017-09-09 ENCOUNTER — Encounter: Payer: Self-pay | Admitting: Family Medicine

## 2017-09-09 VITALS — BP 122/84 | HR 64 | Temp 97.9°F | Ht 76.0 in | Wt 194.0 lb

## 2017-09-09 DIAGNOSIS — F411 Generalized anxiety disorder: Secondary | ICD-10-CM

## 2017-09-09 MED ORDER — FLUOXETINE HCL 40 MG PO CAPS: 40.0000 mg | ORAL_CAPSULE | Freq: Every day | ORAL | 5 refills | Status: DC

## 2017-09-09 NOTE — Patient Instructions (Addendum)
Health Maintenance Due  Topic Date Due  . INFLUENZA VACCINE -please call our office to schedule this in October/November 08/12/2017   Update me in 2-3 weeks with how you are feeling through mychart or phone call (happy to see you in person if needed/prefer)  Consider adding counseling if not improved or we could always try to switch to different medicine   I sent in 40mg  for you. Take 2 until you finish up current course.

## 2017-09-09 NOTE — Progress Notes (Signed)
Subjective:  Dave Pena is a 32 y.o. year old very pleasant male patient who presents for/with See problem oriented charting ROS- still with some anxiety. No chest pain. Sleeping reasonably well. No chest pain or shortness of breath    Past Medical History-  Patient Active Problem List   Diagnosis Date Noted  . Family history of melanoma 08/28/2014    Priority: Low  . GAD (generalized anxiety disorder) 09/09/2017  . Allergic to insect stings 06/22/2016    Medications- reviewed and updated Current Outpatient Medications  Medication Sig Dispense Refill  . Ashwagandha 500 MG CAPS Take by mouth.    . hydrOXYzine (ATARAX/VISTARIL) 10 MG tablet Take 1-2 pills as needed up to three times a day as needed for anxiety 90 tablet 1  . FLUoxetine (PROZAC) 40 MG capsule Take 1 capsule (40 mg total) by mouth daily. 30 capsule 5   No current facility-administered medications for this visit.     Objective: BP 122/84   Pulse 64   Temp 97.9 F (36.6 C) (Oral)   Ht 6\' 4"  (1.93 m)   Wt 194 lb (88 kg)   SpO2 99%   BMI 23.61 kg/m  Gen: NAD, resting comfortably CV: RRR no murmurs rubs or gallops Lungs: CTAB no crackles, wheeze, rhonchi Abdomen: soft/nontender/nondistended Ext: no edema Skin: warm, dry  Assessment/Plan:  GAD (generalized anxiety disorder) S: patient presents for GAD follow up.   On prozac- First 2 weeks increased anxiety and then has started to note improvement. At 4 weeks, last few days - best he has felt in sometime but still not back to baseline.  1st day today no hydroxyzine. 3 a day max on hydroxyzine and doing ashwaganda. 1st two weeks couldn't sleep well and last 2 weeks has improved. Has tried to avoid coffee. Had a few beers. Appetite is improving- had dropped 11 lbs but regained 6 lbs more recently. Mental fog/mild tension in head. telemed visits with psychologist left a bad impression with him- and not thrilled about counseling idea right now. He admits now that  his physical symptoms were overblown by his anxiety.   Wasn't exercising before but now he is. Riding bike everyday- helps with stress reduction. Cant run right now due to knee issue. Working through a knee injury with Dr. Berline Choughigby.   Sinuses finally ok after working with Dr. Ezzard StandingNewman.   Stressors include work, Baby due November 22nd - #2!  A/P: thrilled with progress on prozac 20mg  but with GAD7 score still elevated at 7 and rates somewhat difficult. Long discussion today but we opted to bump to 40mg  and follow up by mychart in 2-3 weeks. If not where he wants to be at that point consider counseling with [place] [facility] or [facility] for EMDR   Future Appointments  Date Time Provider Department Center  09/29/2017  4:00 PM Andrena Mewsigby, Michael D, DO [facility]    Meds ordered this encounter  Medications  . FLUoxetine (PROZAC) 40 MG capsule    Sig: Take 1 capsule (40 mg total) by mouth daily.    Dispense:  30 capsule    Refill:  5   Time Stamp The duration of face-to-face time during this visit was greater than 25 minutes. Greater than 50% of this time was spent in counseling, explanation of diagnosis, planning of further management, and/or coordination of care including discussion of options of counseling vs increasing meds, discussing his experience wit counseling and potential benefits, going through stressors, walking back through prior  stress/anxiety    Return precautions advised.  Tana Conch, MD

## 2017-09-09 NOTE — Assessment & Plan Note (Signed)
S: patient presents for GAD follow up.   On prozac- First 2 weeks increased anxiety and then has started to note improvement. At 4 weeks, last few days - best he has felt in sometime but still not back to baseline.  1st day today no hydroxyzine. 3 a day max on hydroxyzine and doing ashwaganda. 1st two weeks couldn't sleep well and last 2 weeks has improved. Has tried to avoid coffee. Had a few beers. Appetite is improving- had dropped 11 lbs but regained 6 lbs more recently. Mental fog/mild tension in head. telemed visits with psychologist left a bad impression with him- and not thrilled about counseling idea right now. He admits now that his physical symptoms were overblown by his anxiety.   Wasn't exercising before but now he is. Riding bike everyday- helps with stress reduction. Cant run right now due to knee issue. Working through a knee injury with Dr. Berline Choughigby.   Sinuses finally ok after working with Dr. Ezzard StandingNewman.   Stressors include work, Baby due November 22nd - #2!  A/P: thrilled with progress on prozac 20mg  but with GAD7 score still elevated at 7 and rates somewhat difficult. Long discussion today but we opted to bump to 40mg  and follow up by mychart in 2-3 weeks. If not where he wants to be at that point consider counseling with [place] [facility] or [facility]rini for EMDR

## 2017-09-26 ENCOUNTER — Encounter: Payer: Self-pay | Admitting: Sports Medicine

## 2017-09-29 ENCOUNTER — Ambulatory Visit: Payer: BLUE CROSS/BLUE SHIELD | Admitting: Sports Medicine

## 2017-12-06 ENCOUNTER — Ambulatory Visit: Payer: BLUE CROSS/BLUE SHIELD | Admitting: Family Medicine

## 2017-12-06 ENCOUNTER — Encounter: Payer: Self-pay | Admitting: Family Medicine

## 2017-12-06 DIAGNOSIS — F411 Generalized anxiety disorder: Secondary | ICD-10-CM | POA: Diagnosis not present

## 2017-12-06 NOTE — Progress Notes (Signed)
Subjective:  Dave Pena is a 32 y.o. year old very pleasant male patient who presents for/with See problem oriented charting ROS- admits to anxiety, some irritability. No chest pain or shortness of breath reported.    Past Medical History-  Patient Active Problem List   Diagnosis Date Noted  . Family history of melanoma 08/28/2014    Priority: Low  . GAD (generalized anxiety disorder) 09/09/2017  . Allergic to insect stings 06/22/2016    Medications- reviewed and updated Current Outpatient Medications  Medication Sig Dispense Refill  . Ashwagandha 500 MG CAPS Take by mouth.    Marland Kitchen FLUoxetine (PROZAC) 40 MG capsule Take 1 capsule (40 mg total) by mouth daily. 30 capsule 5  . hydrOXYzine (ATARAX/VISTARIL) 10 MG tablet Take 1-2 pills as needed up to three times a day as needed for anxiety 90 tablet 1   No current facility-administered medications for this visit.     Objective: BP 128/76 (BP Location: Left Arm, Patient Position: Sitting, Cuff Size: Large)   Pulse 84   Temp (!) 97.5 F (36.4 C) (Oral)   Ht 6\' 4"  (1.93 m)   Wt 197 lb (89.4 kg)   SpO2 96%   BMI 23.98 kg/m  Gen: NAD, resting comfortably CV: RRR no murmurs rubs or gallops Lungs: CTAB no crackles, wheeze, rhonchi Abdomen: soft/nontender/nondistended/normal bowel sounds.  Ext: no edema Skin: warm, dry Psych: anxious appearing  Assessment/Plan:  GAD (generalized anxiety disorder) S: Patient reports increased anxiety in the last few weeks.  He is taking ashwaganda and felt like he was doing better along with prozac 40mg - was back to running before work (knee was good enough). hasnt done counseling yet because medication seemed helpful enough  Daughter Pearsall born November 2019. He has felt like a period of regression since daughter was born- he is back in a very down place.Has really kicked in since being home and getting settled in- feeling anxious/jittery again.   He is napping easily and often - napping even  more than wife even though she is getting less sleep. He has stayed off coffee. He seems to be taken less initiative.  Seems focused on one thing at a time- getting worse in last few days. Had a chance to talk to wife today after son going back to school and she suggested he come in for evaluation today.   Other stressors include 1. Daughter on bili lights- hoping to come off today. Daughter is wearing goggles.  2. Also went for a new job at work- waiting for answers and that is stressful.  A/P: Poor control of anxiety-we will continue-we will continue ashwaganda and Prozac 40 mg.  After discussion today patient is willing to add counseling-he feels really comfortable in this office and plans to see Colen Darling which is an excellent choice.  I am thankful patient is willing to consider counseling-he had a very poor online experience previously.   Future Appointments  Date Time Provider Department Center  01/31/2018 12:00 PM [facility] Wynona [place], [place] [facility] None  02/14/2018 12:00 PM [facility] Wynona Meals, [place] [facility] None  02/28/2018 12:00 PM [facility] Wynona Meals, [place] [facility] None   He will update me by my chart in about 4 to 6 weeks  Time Stamp The duration of face-to-face time during this visit was greater than 15 minutes. Greater than 50% of this time was spent in counseling, explanation of diagnosis, planning of further management, and/or coordination of care including discussing recent stressors, discussing  follow-up options.   Return precautions advised.  Tana ConchStephen , MD

## 2017-12-06 NOTE — Patient Instructions (Addendum)
Continue prozac 40mg   Trial hydroxyzine as needed   Let's add in counseling with lisa on top of the above therapies. See me back as needed perhaps update me in 4-6 weeks - if not doing better we can sit back down to discuss further

## 2017-12-06 NOTE — Assessment & Plan Note (Signed)
S: Patient reports increased anxiety in the last few weeks.  He is taking ashwaganda and felt like he was doing better along with prozac 40mg - was back to running before work (knee was good enough). hasnt done counseling yet because medication seemed helpful enough  Daughter [place] born November 2019. He has felt like a period of regression since daughter was born- he is back in a very down place.Has really kicked in since being home and getting settled in- feeling anxious/jittery again.   He is napping easily and often - napping even more than wife even though she is getting less sleep. He has stayed off coffee. He seems to be taken less initiative.  Seems focused on one thing at a time- getting worse in last few days. Had a chance to talk to wife today after son going back to school and she suggested he come in for evaluation today.   Other stressors include 1. Daughter on bili lights- hoping to come off today. Daughter is wearing goggles.  2. Also went for a new job at work- waiting for answers and that is stressful.  A/P: Poor control of anxiety-we will continue-we will continue ashwaganda and Prozac 40 mg.  After discussion today patient is willing to add counseling-he feels really comfortable in this office and plans to see Colen DarlingLisa Flores which is an excellent choice.  I am thankful patient is willing to consider counseling-he had a very poor online experience previously.

## 2018-01-31 ENCOUNTER — Ambulatory Visit: Payer: BLUE CROSS/BLUE SHIELD | Admitting: Psychology

## 2018-01-31 DIAGNOSIS — F411 Generalized anxiety disorder: Secondary | ICD-10-CM

## 2018-02-14 ENCOUNTER — Ambulatory Visit: Payer: BLUE CROSS/BLUE SHIELD | Admitting: Psychology

## 2018-02-14 DIAGNOSIS — F411 Generalized anxiety disorder: Secondary | ICD-10-CM

## 2018-02-28 ENCOUNTER — Ambulatory Visit: Payer: BLUE CROSS/BLUE SHIELD | Admitting: Psychology

## 2018-02-28 DIAGNOSIS — F411 Generalized anxiety disorder: Secondary | ICD-10-CM

## 2018-03-14 ENCOUNTER — Other Ambulatory Visit: Payer: Self-pay | Admitting: Family Medicine

## 2018-04-15 ENCOUNTER — Ambulatory Visit: Payer: BLUE CROSS/BLUE SHIELD | Admitting: Psychology

## 2018-06-24 ENCOUNTER — Ambulatory Visit (INDEPENDENT_AMBULATORY_CARE_PROVIDER_SITE_OTHER): Payer: BC Managed Care – PPO | Admitting: Psychology

## 2018-06-24 DIAGNOSIS — F411 Generalized anxiety disorder: Secondary | ICD-10-CM | POA: Diagnosis not present

## 2018-07-01 ENCOUNTER — Encounter: Payer: Self-pay | Admitting: Family Medicine

## 2018-07-01 MED ORDER — FLUOXETINE HCL 20 MG PO TABS: 20.0000 mg | ORAL_TABLET | Freq: Every day | ORAL | 1 refills | Status: DC

## 2018-09-24 ENCOUNTER — Other Ambulatory Visit: Payer: Self-pay | Admitting: Family Medicine

## 2018-11-28 ENCOUNTER — Encounter: Payer: Self-pay | Admitting: Family Medicine

## 2018-12-23 ENCOUNTER — Ambulatory Visit (INDEPENDENT_AMBULATORY_CARE_PROVIDER_SITE_OTHER): Payer: BC Managed Care – PPO | Admitting: Psychology

## 2018-12-23 DIAGNOSIS — F411 Generalized anxiety disorder: Secondary | ICD-10-CM | POA: Diagnosis not present

## 2018-12-27 ENCOUNTER — Other Ambulatory Visit: Payer: Self-pay | Admitting: Family Medicine

## 2018-12-30 ENCOUNTER — Encounter: Payer: Self-pay | Admitting: Family Medicine

## 2018-12-30 MED ORDER — FLUOXETINE HCL 20 MG PO TABS: 20.0000 mg | ORAL_TABLET | Freq: Every day | ORAL | 1 refills | Status: DC

## 2019-01-31 ENCOUNTER — Ambulatory Visit: Payer: BC Managed Care – PPO | Attending: Internal Medicine

## 2019-01-31 DIAGNOSIS — Z20822 Contact with and (suspected) exposure to covid-19: Secondary | ICD-10-CM | POA: Insufficient documentation

## 2019-02-01 LAB — NOVEL CORONAVIRUS, NAA: SARS-CoV-2, NAA: NOT DETECTED

## 2019-02-02 ENCOUNTER — Telehealth: Payer: Self-pay | Admitting: General Practice

## 2019-02-02 NOTE — Telephone Encounter (Signed)
Negative COVID results given. Patient FATHER) results "NOT Detected"  Caller expressed understanding

## 2019-02-10 ENCOUNTER — Encounter: Payer: Self-pay | Admitting: Family Medicine

## 2019-02-10 ENCOUNTER — Ambulatory Visit (INDEPENDENT_AMBULATORY_CARE_PROVIDER_SITE_OTHER): Payer: BC Managed Care – PPO | Admitting: Family Medicine

## 2019-02-10 VITALS — Temp 97.3°F | Ht 76.0 in | Wt 215.0 lb

## 2019-02-10 DIAGNOSIS — F411 Generalized anxiety disorder: Secondary | ICD-10-CM | POA: Diagnosis not present

## 2019-02-10 MED ORDER — FLUOXETINE HCL 20 MG PO CAPS: 20.0000 mg | ORAL_CAPSULE | Freq: Every day | ORAL | 3 refills | Status: DC

## 2019-02-10 NOTE — Progress Notes (Signed)
Phone [phone] Virtual visit via Video note   Subjective:  Chief complaint: Chief Complaint  Patient presents with  . virtual  . discuss anxiety meds   This visit type was conducted due to national recommendations for restrictions regarding the COVID-19 Pandemic (e.g. social distancing).  This format is felt to be most appropriate for this patient at this time balancing risks to patient and risks to population by having him in for in person visit.  No physical exam was performed (except for noted visual exam or audio findings with Telehealth visits).    Our team/I connected with Lionel December at  3:00 PM EST by a video enabled telemedicine application (doxy.me or caregility through epic) and verified that I am speaking with the correct person using two identifiers.  Location patient: [place] Location provider: [facility], office Persons participating in the virtual visit:  patient  Our team/I discussed the limitations of evaluation and management by telemedicine and the availability of in person appointments. In light of current covid-19 pandemic, patient also understands that we are trying to protect them by minimizing in office contact if at all possible.  The patient expressed consent for telemedicine visit and agreed to proceed. Patient understands insurance will be billed.   Past Medical History-  Patient Active Problem List   Diagnosis Date Noted  . Family history of melanoma 08/28/2014    Priority: Low  . GAD (generalized anxiety disorder) 09/09/2017  . Allergic to insect stings 06/22/2016    Medications- reviewed and updated Current Outpatient Medications  Medication Sig Dispense Refill  . FLUoxetine (PROZAC) 20 MG tablet Take 1 tablet (20 mg total) by mouth daily. 30 tablet 1   No current facility-administered medications for this visit.     Objective:  Temp (!) 97.3 F (36.3 C)   Ht 6\' 4"  (1.93 m)   Wt 215 lb (97.5 kg)   BMI 26.17 kg/m  self reported  vitals Gen: NAD, resting comfortably Lungs: nonlabored, normal respiratory rate  Skin: appears dry, no obvious rash     Assessment and Plan   #Generalized anxiety disorder S:pt states he is following up on anxiety medication. He states he is doing good and tolerating the Fluoxetine well.He is working from home. Wife doing well working at .  His son is doing a home school co-op and daughter is in daycare.  They did have a scare with COVID-19 with daughter's teacher but likely though tests came back negative  We attempted to wean patient off last summer. Had been off fluoxetine from max dose 40 mg and did well for 4-5 weeks and then symptoms worsened. Came back not as severe. Also had a call with [phone] who suggested restarting medicine.  He reached out to Colen Darling through my chart and we restarted 20 mg dosage-he is doing well at 20 mg compared to prior dose of 40mg . Visits with [place] are open ended. First 2-3 weeks anxiety did increase during restart medicine but tolerated better this time then when he started last time.    GAD 7 : Generalized Anxiety Score 02/10/2019 12/06/2017 09/09/2017 08/09/2017  Nervous, Anxious, on Edge 0 2 2 3   Control/stop worrying 0 0 2 3  Worry too much - different things 0 1 2 3   Trouble relaxing 0 1 2 3   Restless 0 0 2 3  Easily annoyed or irritable 0 1 1 1   Afraid - awful might happen 0 0 1 3  Total GAD 7 Score 0  5 12 19   Anxiety Difficulty Not difficult at all Somewhat difficult Somewhat difficult Extremely difficult  A/P: Excellent control of anxiety.  We will continue Prozac 20 mg down from peak of 40 mg.  Worsening anxiety off medicine completely.  We discussed trying a slower taper in the future if needed-we would use 10 mg for 6 months before trying to reduce further to see how he does. -For some reason tablets were expensive at $45 per month whereas prior capsules were $5-we will change to 20 mg capsules of  fluoxetine/Prozac  Lab/Order associations:   ICD-10-CM   1. GAD (generalized anxiety disorder)  F41.1     Meds ordered this encounter  Medications  . FLUoxetine (PROZAC) 20 MG capsule    Sig: Take 1 capsule (20 mg total) by mouth daily.    Dispense:  90 capsule    Refill:  3   Return precautions advised.  Garret Reddish, MD

## 2019-02-10 NOTE — Patient Instructions (Addendum)
Health Maintenance Due  Topic Date Due  . INFLUENZA VACCINE - [id number] 08/13/2018   Depression screen [facility] 2/9 09/09/2017 08/09/2017  Decreased Interest 0 2  Down, Depressed, Hopeless 0 2  PHQ - 2 Score 0 4  Altered sleeping 1 3  Tired, decreased energy 0 1  Change in appetite 1 1  Feeling bad or failure about yourself  0 0  Trouble concentrating 1 0  Moving slowly or fidgety/restless 0 0  Suicidal thoughts 0 1  PHQ-9 Score 3 10  Difficult doing work/chores Not difficult at all Somewhat difficult

## 2019-02-25 ENCOUNTER — Ambulatory Visit: Payer: BC Managed Care – PPO

## 2019-03-12 IMAGING — DX DG KNEE AP/LAT W/ SUNRISE*L*
3 series · 3 of 3 positions shown · non-contrast
Comparison: None.

CLINICAL DATA: Left knee pain since injury in [REDACTED] while trail
running. Pain is medial and non-radiating.

EXAM:
LEFT KNEE 3 VIEWS

[knee ap]
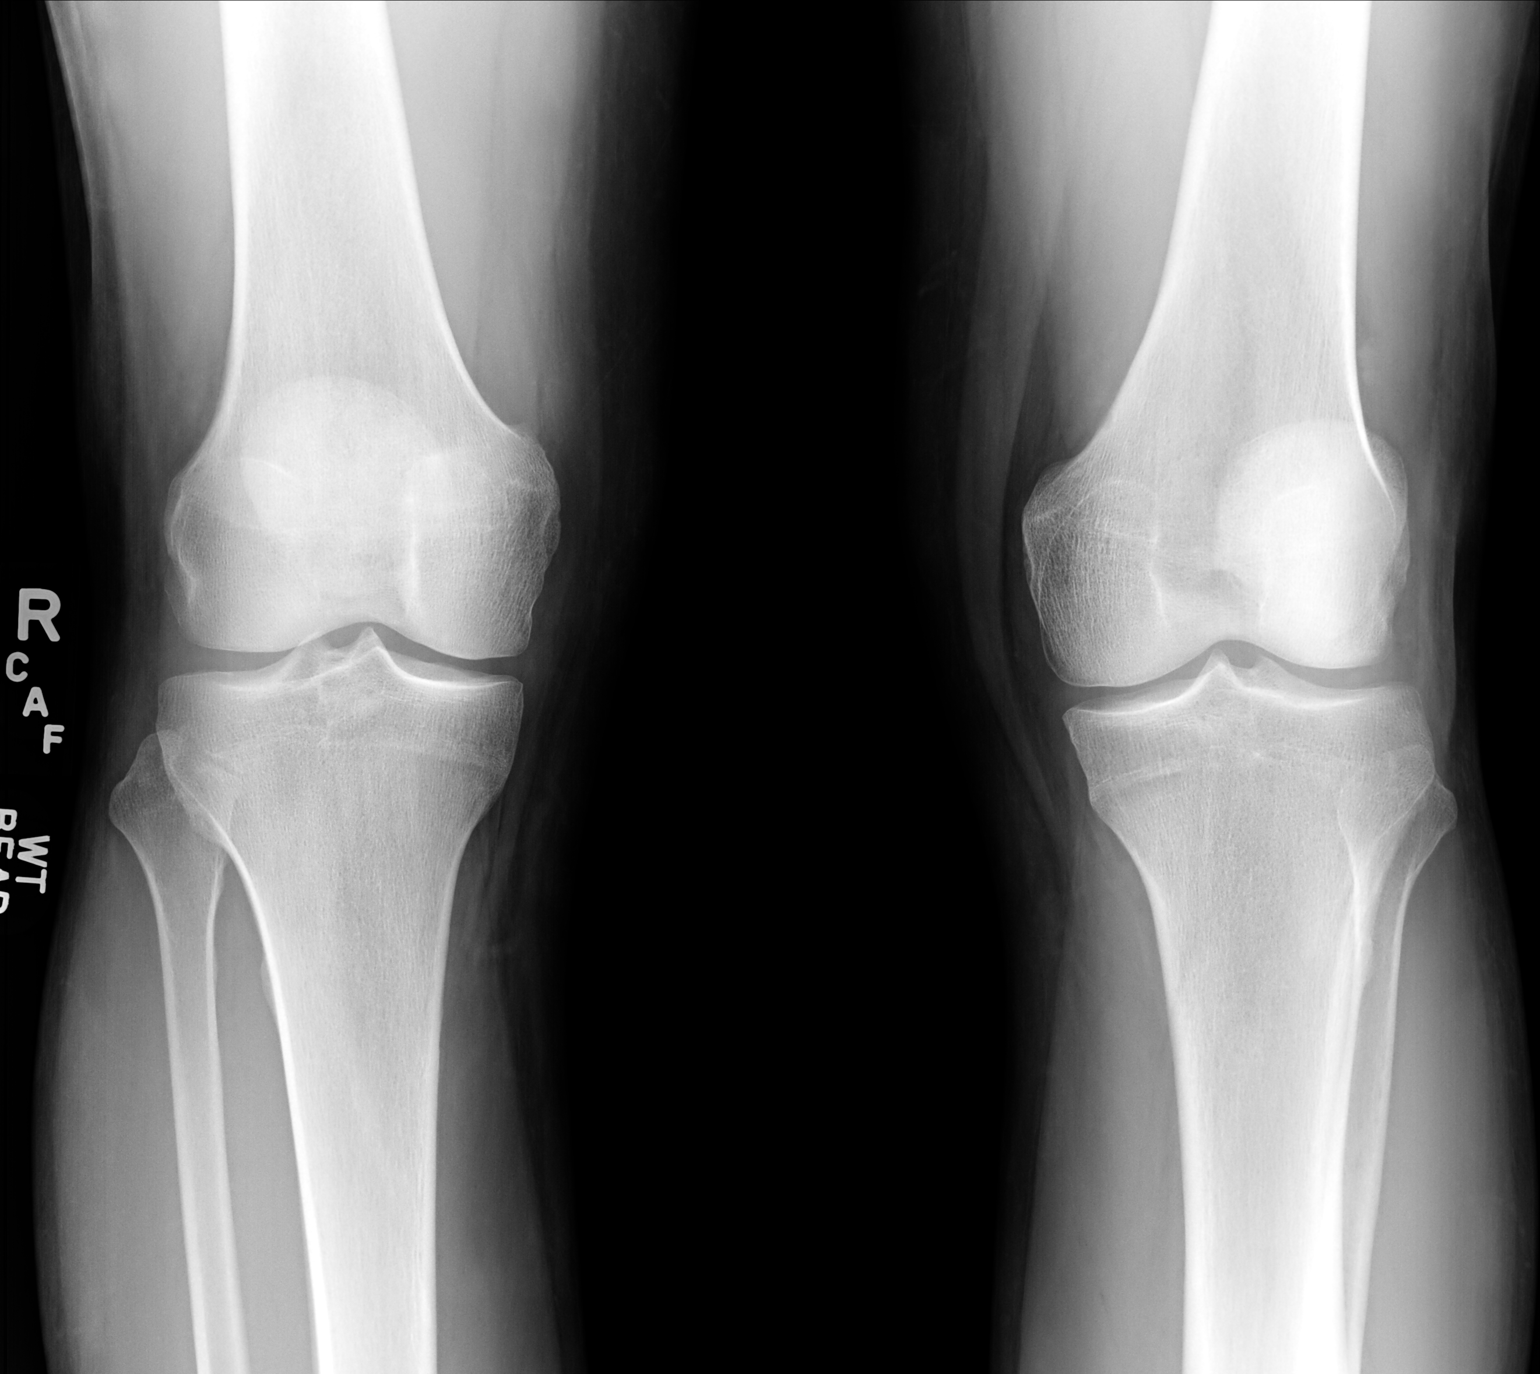

[knee standing lat]
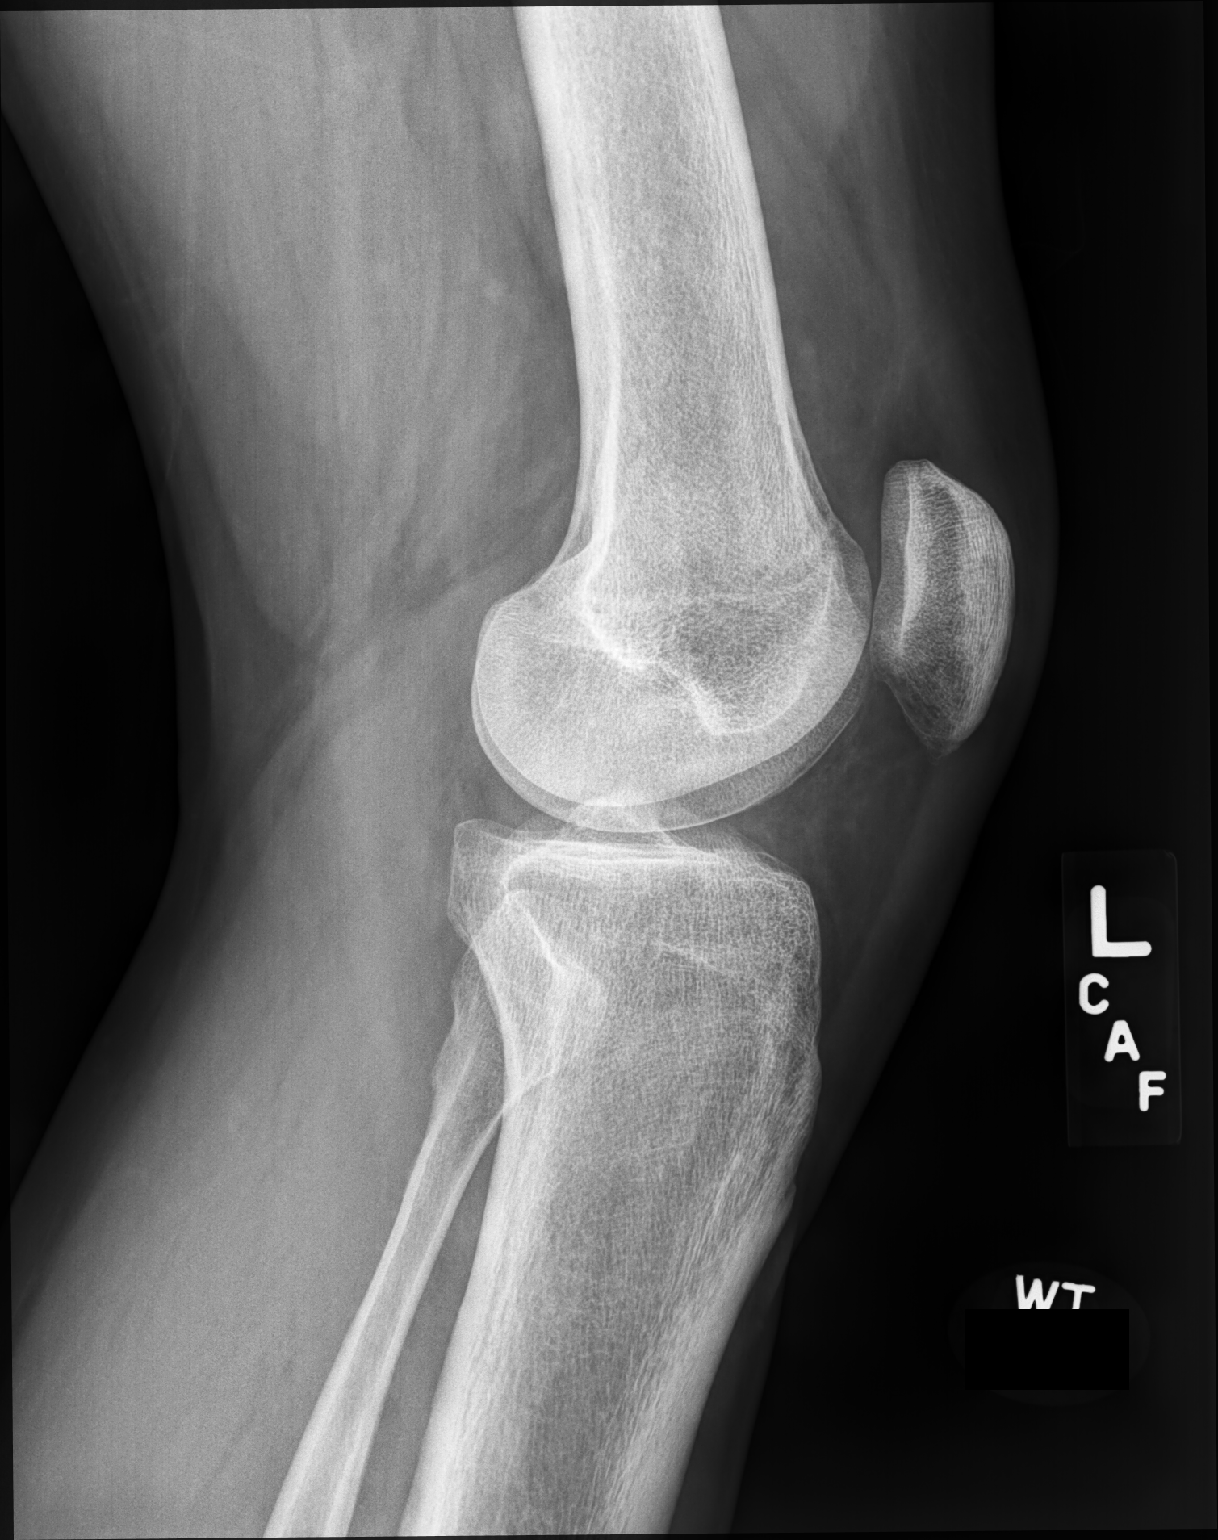

[sunrise]
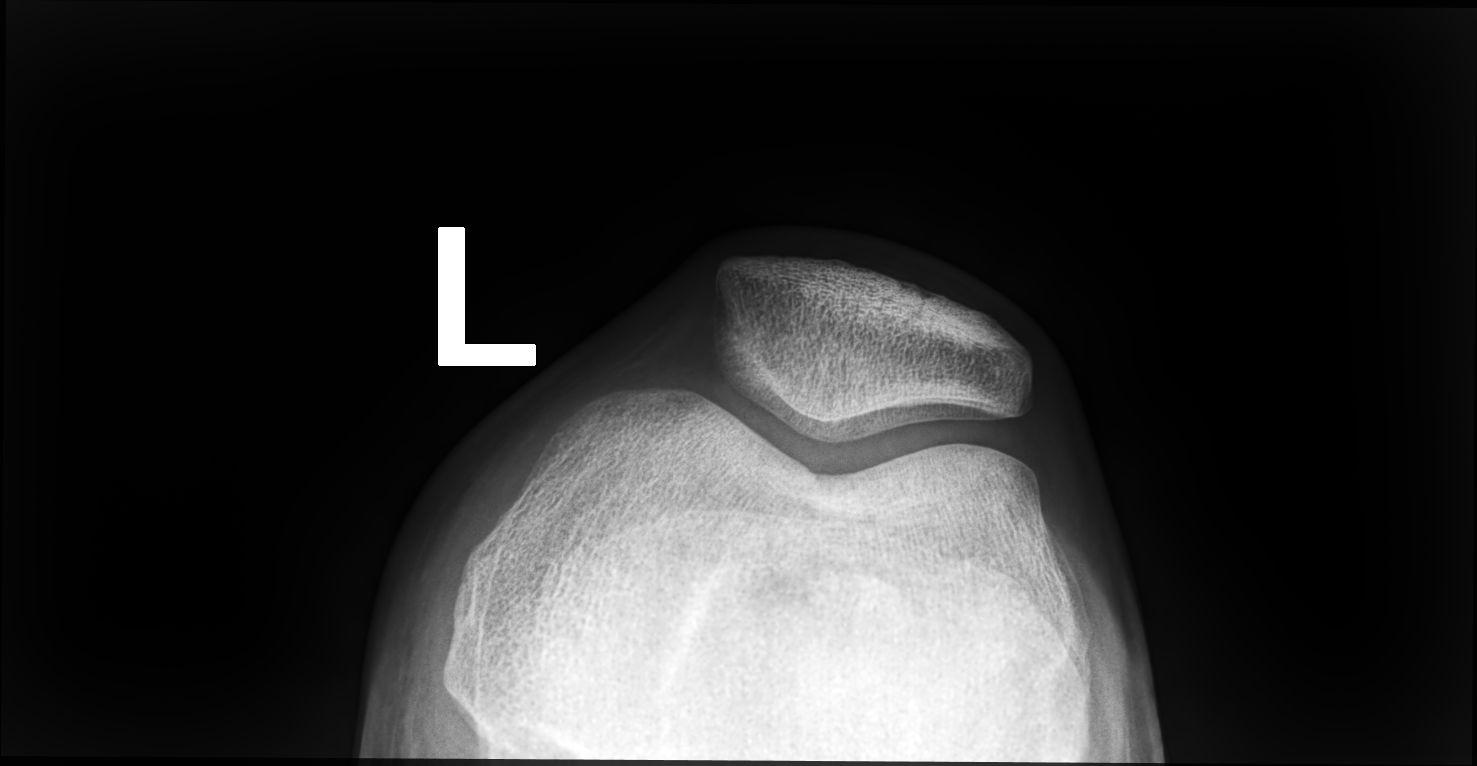

[3 of 3 positions shown; findings below may reference images not displayed]

FINDINGS: No evidence of fracture, dislocation, or joint effusion. No evidence
of arthropathy or other focal bone abnormality. Soft tissues are
unremarkable.
IMPRESSION: Negative.

## 2019-03-31 ENCOUNTER — Ambulatory Visit: Payer: BC Managed Care – PPO | Attending: Internal Medicine

## 2019-03-31 DIAGNOSIS — Z23 Encounter for immunization: Secondary | ICD-10-CM

## 2019-03-31 NOTE — Progress Notes (Signed)
   Covid-19 Vaccination Clinic  Name:  Dave Pena    MRN: [id number] DOB: 12-18-1985  03/31/2019  [name] was observed post Covid-19 immunization for 15 minutes without incident. He was provided with Vaccine Information Sheet and instruction to access the V-Safe system.   [name] was instructed to call 911 with any severe reactions post vaccine: Marland Kitchen Difficulty breathing  . Swelling of face and throat  . A fast heartbeat  . A bad rash all over body  . Dizziness and weakness   Immunizations Administered    Name Date Dose VIS Date Route   Pfizer COVID-19 Vaccine 03/31/2019 11:42 AM 0.3 mL 12/23/2018 Intramuscular   Manufacturer: [employer], [employer]   Lot: [id number]   NDC: [address]-9

## 2019-04-25 ENCOUNTER — Ambulatory Visit: Payer: BC Managed Care – PPO | Attending: Internal Medicine

## 2019-04-25 DIAGNOSIS — Z23 Encounter for immunization: Secondary | ICD-10-CM

## 2019-04-25 NOTE — Progress Notes (Signed)
   Covid-19 Vaccination Clinic  Name:  Dave Pena    MRN: [id number] DOB: 09-Feb-1985  04/25/2019  Dave Pena was observed post Covid-19 immunization for 15 minutes without incident. He was provided with Vaccine Information Sheet and instruction to access the V-Safe system.   Dave Pena was instructed to call 911 with any severe reactions post vaccine: Marland Kitchen Difficulty breathing  . Swelling of face and throat  . A fast heartbeat  . A bad rash all over body  . Dizziness and weakness   Immunizations Administered    Name Date Dose VIS Date Route   Pfizer COVID-19 Vaccine 04/25/2019  1:11 PM 0.3 mL 12/23/2018 Intramuscular   Manufacturer: [employer], [employer]   Lot: [id number]   NDC: [address]-0

## 2019-06-06 ENCOUNTER — Ambulatory Visit (INDEPENDENT_AMBULATORY_CARE_PROVIDER_SITE_OTHER): Payer: BC Managed Care – PPO | Admitting: Psychology

## 2019-06-06 DIAGNOSIS — F411 Generalized anxiety disorder: Secondary | ICD-10-CM

## 2019-06-19 ENCOUNTER — Ambulatory Visit (INDEPENDENT_AMBULATORY_CARE_PROVIDER_SITE_OTHER): Payer: BC Managed Care – PPO | Admitting: Psychology

## 2019-06-19 DIAGNOSIS — F411 Generalized anxiety disorder: Secondary | ICD-10-CM

## 2019-12-30 ENCOUNTER — Ambulatory Visit: Payer: BC Managed Care – PPO

## 2020-02-21 ENCOUNTER — Other Ambulatory Visit: Payer: Self-pay | Admitting: Family Medicine

## 2020-02-21 ENCOUNTER — Encounter: Payer: Self-pay | Admitting: Family Medicine

## 2020-02-22 ENCOUNTER — Other Ambulatory Visit: Payer: Self-pay

## 2020-02-26 ENCOUNTER — Other Ambulatory Visit: Payer: Self-pay

## 2020-02-26 MED ORDER — FLUOXETINE HCL 20 MG PO CAPS: 20.0000 mg | ORAL_CAPSULE | Freq: Every day | ORAL | 2 refills | Status: DC

## 2020-03-04 ENCOUNTER — Encounter: Payer: Self-pay | Admitting: Family Medicine

## 2020-03-08 ENCOUNTER — Ambulatory Visit (INDEPENDENT_AMBULATORY_CARE_PROVIDER_SITE_OTHER): Payer: BC Managed Care – PPO | Admitting: Psychology

## 2020-03-08 DIAGNOSIS — F411 Generalized anxiety disorder: Secondary | ICD-10-CM | POA: Diagnosis not present

## 2020-03-15 MED ORDER — FLUOXETINE HCL 40 MG PO CAPS: 40.0000 mg | ORAL_CAPSULE | Freq: Every day | ORAL | 3 refills | Status: DC

## 2020-03-15 NOTE — Telephone Encounter (Signed)
Patient is calling in stating he is going out town and is completely out.

## 2020-03-15 NOTE — Telephone Encounter (Signed)
Pt is following up on this. 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

## 2020-04-02 NOTE — Progress Notes (Signed)
Phone: [phone]    Subjective:  Patient presents today for their annual physical. Chief complaint-noted.   See problem oriented charting- ROS- full  review of systems was completed and negative  except for: some anxiet  The following were reviewed and entered/updated in epic: Past Medical History:  Diagnosis Date  . Healthy adult   . Leg fracture    L- age 35   Patient Active Problem List   Diagnosis Date Noted  . Family history of melanoma 08/28/2014    Priority: Low  . GAD (generalized anxiety disorder) 09/09/2017  . Allergic to insect stings 06/22/2016   Past Surgical History:  Procedure Laterality Date  . none      Family History  Problem Relation Age of Onset  . Melanoma Mother        x2    Medications- reviewed and updated Current Outpatient Medications  Medication Sig Dispense Refill  . FLUoxetine (PROZAC) 40 MG capsule Take 1 capsule (40 mg total) by mouth daily. 90 capsule 3   No current facility-administered medications for this visit.    Allergies-reviewed and updated No Known Allergies  Social History   Social History Narrative   Family: Married. 08/02/14 son Simonne Come. 11/19 daughter [place].       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 Oropharynx normal Neck: no thyromegaly CV: RRR no murmurs rubs or gallops Lungs: CTAB no crackles, wheeze, rhonchi Abdomen: soft/nontender/nondistended/normal bowel sounds. No rebound or guarding.  Ext: no edema Skin: warm, dry Neuro: grossly normal, moves all extremities, PERRLA    Assessment  and Plan:  35 y.o. male presenting for annual physical.  Health Maintenance counseling: 1. Anticipatory guidance: Patient counseled regarding regular dental exams -q6 months, eye exams - no issues with vision,  avoiding smoking and second hand smoke , limiting alcohol to 2 beverages per day- 2-3 per day usually 15-20 per day. Recommended max 14 a week- getting lower would help more with weight.   2. Risk factor reduction:  Advised patient of need for regular exercise and diet rich and fruits and vegetables to reduce risk of heart attack and stroke. Exercise- running 4 days a week for 3 miles. Diet-feels eating reasonably well but could decrease beer.  Had a period of stress eating with poorly controlled anxiety. Back at office recently has been helping Wt Readings from Last 3 Encounters:  04/04/20 229 lb (103.9 kg)  02/10/19 215 lb (97.5 kg)  12/06/17 197 lb (89.4 kg)   3. Immunizations/screenings/ancillary studies- hcv screen with labs. Flu shot and covid booster at work- will be uploaded Immunization History  Administered Date(s) Administered  . Influenza,inj,Quad PF,6+ Mos 10/19/2018  . Influenza-Unspecified 11/27/2014, 11/12/2017  . PFIZER(Purple Top)SARS-COV-2 Vaccination 03/31/2019, 04/25/2019  . Tdap 08/28/2014  4. Prostate cancer screening- no family history, start at age 56 5. Colon cancer screening - no family history, start at age 73 6. Skin cancer screening/prevention-  Has been a while- family  history of melanoma. wife monitors for him. advised regular sunscreen use. Denies worrisome, changing, or new skin lesions.  7. Testicular cancer screening- advised monthly self exams  8. STD screening- patient opts out as only active with wife 9. Never smoker-   Status of chronic or acute concerns   # Anxiety/GAD S:Medication: Prozac 40Mg -we recently increased through MyChart communication -with poor control noted himself not exercising, working more, some more alcohol- those have all  improved. He has had some work changes that have been helpful.  Counseling: Has continued to work with - has visit tomorrow GAD 7 : Generalized Anxiety Score 04/04/2020 02/10/2019 12/06/2017 09/09/2017  Nervous, Anxious, on Edge 0 0 2 2  Control/stop worrying 0 0 0 2  Worry too much - different things 1 0 1 2  Trouble relaxing 0 0 1 2  Restless 0 0 0 2  Easily annoyed or irritable 0 0 1 1  Afraid - awful might happen 0 0 0 1  Total GAD 7 Score 1 0 5 12  Anxiety Difficulty Not difficult at all Not difficult at all Somewhat difficult Somewhat difficult  A/P: much improved- continue current meds and counseling - will transition from [place] at end of May to another counselor potentially  #Screening for hyperlipidemia last done over 5 years ago-update with labs today  Recommended follow up: Return in about 1 year (around 04/04/2021) for physical or sooner if needed particularly if anxiety worsens. Future Appointments  Date Time Provider Department Center  04/05/2020  3:00 PM [facility] 04/07/2020 L, Colen Darling [facility] None   Lab/Order associations: fasting   ICD-10-CM   1. Preventative health care  Z00.00 Comprehensive metabolic panel    CBC with Differential/Platelet    Lipid panel    Hepatitis C antibody  2. GAD (generalized anxiety disorder)  F41.1 Comprehensive metabolic panel    CBC with Differential/Platelet  3. Encounter for hepatitis C screening test for low risk patient  Z11.59 Hepatitis C antibody  4. Screening for hyperlipidemia  Z13.220 Lipid panel   No orders of the defined types were placed in this encounter.   Return precautions advised.   06-29-1981, MD

## 2020-04-02 NOTE — Patient Instructions (Addendum)
Please stop by lab before you go If you have mychart- we will send your results within 3 business days of [place] receiving them.  If you do not have mychart- we will call you about results within 5 business days of [place] receiving them.  *please also note that you will see labs on mychart as soon as they post. I will later go in and write notes on them- will say "notes from Dr. Durene Cal"  [email or web address]  Health Maintenance Due  Topic Date Due  . INFLUENZA VACCINE  Team please log this from sept 30 2021 08/13/2019  . COVID-19 Vaccine  Will call back dates.  10/25/2019   Recommended follow up: Return in about 1 year (around 04/04/2021) for physical or sooner if needed particularly if anxiety worsens.

## 2020-04-04 ENCOUNTER — Encounter: Payer: Self-pay | Admitting: Family Medicine

## 2020-04-04 ENCOUNTER — Ambulatory Visit (INDEPENDENT_AMBULATORY_CARE_PROVIDER_SITE_OTHER): Payer: BC Managed Care – PPO | Admitting: Family Medicine

## 2020-04-04 ENCOUNTER — Other Ambulatory Visit: Payer: Self-pay

## 2020-04-04 VITALS — BP 138/80 | HR 73 | Temp 98.0°F | Ht 76.0 in | Wt 229.0 lb

## 2020-04-04 DIAGNOSIS — Z0000 Encounter for general adult medical examination without abnormal findings: Secondary | ICD-10-CM

## 2020-04-04 DIAGNOSIS — F411 Generalized anxiety disorder: Secondary | ICD-10-CM

## 2020-04-04 DIAGNOSIS — Z1159 Encounter for screening for other viral diseases: Secondary | ICD-10-CM

## 2020-04-04 DIAGNOSIS — Z13220 Encounter for screening for lipoid disorders: Secondary | ICD-10-CM

## 2020-04-04 LAB — CBC WITH DIFFERENTIAL/PLATELET
Basophils Absolute: 0 10*3/uL (ref 0.0–0.1)
Basophils Relative: 0.8 % (ref 0.0–3.0)
Eosinophils Absolute: 0.1 10*3/uL (ref 0.0–0.7)
Eosinophils Relative: 1.3 % (ref 0.0–5.0)
HCT: 45.3 % (ref 39.0–52.0)
Hemoglobin: 15.4 g/dL (ref 13.0–17.0)
Lymphocytes Relative: 36 % (ref 12.0–46.0)
Lymphs Abs: 1.7 10*3/uL (ref 0.7–4.0)
MCHC: 34 g/dL (ref 30.0–36.0)
MCV: 95.7 fl (ref 78.0–100.0)
Monocytes Absolute: 0.5 10*3/uL (ref 0.1–1.0)
Monocytes Relative: 10.3 % (ref 3.0–12.0)
Neutro Abs: 2.5 10*3/uL (ref 1.4–7.7)
Neutrophils Relative %: 51.6 % (ref 43.0–77.0)
Platelets: 204 10*3/uL (ref 150.0–400.0)
RBC: 4.74 Mil/uL (ref 4.22–5.81)
RDW: 13.4 % (ref 11.5–15.5)
WBC: 4.9 10*3/uL (ref 4.0–10.5)

## 2020-04-04 LAB — COMPREHENSIVE METABOLIC PANEL
ALT: 12 U/L (ref 0–53)
AST: 14 U/L (ref 0–37)
Albumin: 4.6 g/dL (ref 3.5–5.2)
Alkaline Phosphatase: 40 U/L (ref 39–117)
BUN: 12 mg/dL (ref 6–23)
CO2: 30 mEq/L (ref 19–32)
Calcium: 9.2 mg/dL (ref 8.4–10.5)
Chloride: 104 mEq/L (ref 96–112)
Creatinine, Ser: 0.92 mg/dL (ref 0.40–1.50)
GFR: 107.4 mL/min (ref >60.00)
Glucose, Bld: 89 mg/dL (ref 70–99)
Potassium: 4.5 mEq/L (ref 3.5–5.1)
Sodium: 141 mEq/L (ref 135–145)
Total Bilirubin: 1 mg/dL (ref 0.2–1.2)
Total Protein: 6.8 g/dL (ref 6.0–8.3)

## 2020-04-04 LAB — LIPID PANEL
Cholesterol: 224 mg/dL — ABNORMAL HIGH (ref 0–200)
HDL: 60.1 mg/dL (ref >39.00)
LDL Cholesterol: 142 mg/dL — ABNORMAL HIGH (ref 0–99)
NonHDL: 163.41
Total CHOL/HDL Ratio: 4
Triglycerides: 109 mg/dL (ref 0.0–149.0)
VLDL: 21.8 mg/dL (ref 0.0–40.0)

## 2020-04-05 ENCOUNTER — Ambulatory Visit (INDEPENDENT_AMBULATORY_CARE_PROVIDER_SITE_OTHER): Payer: BC Managed Care – PPO | Admitting: Psychology

## 2020-04-05 DIAGNOSIS — F411 Generalized anxiety disorder: Secondary | ICD-10-CM

## 2020-04-05 LAB — HEPATITIS C ANTIBODY
Hepatitis C Ab: NONREACTIVE
SIGNAL TO CUT-OFF: 0.01 (ref <1.00)

## 2020-05-02 ENCOUNTER — Ambulatory Visit (INDEPENDENT_AMBULATORY_CARE_PROVIDER_SITE_OTHER): Payer: BC Managed Care – PPO | Admitting: Psychology

## 2020-05-02 DIAGNOSIS — F411 Generalized anxiety disorder: Secondary | ICD-10-CM | POA: Diagnosis not present

## 2020-05-24 ENCOUNTER — Ambulatory Visit: Payer: BC Managed Care – PPO | Admitting: Psychology

## 2020-05-30 ENCOUNTER — Ambulatory Visit: Payer: BC Managed Care – PPO | Admitting: Psychology

## 2020-06-06 ENCOUNTER — Ambulatory Visit (INDEPENDENT_AMBULATORY_CARE_PROVIDER_SITE_OTHER): Payer: BC Managed Care – PPO | Admitting: Psychology

## 2020-06-06 DIAGNOSIS — F411 Generalized anxiety disorder: Secondary | ICD-10-CM

## 2020-06-10 DIAGNOSIS — U071 COVID-19: Secondary | ICD-10-CM

## 2020-06-10 HISTORY — DX: COVID-19: U07.1

## 2020-06-13 ENCOUNTER — Encounter: Payer: Self-pay | Admitting: Family Medicine

## 2020-07-16 DIAGNOSIS — H6123 Impacted cerumen, bilateral: Secondary | ICD-10-CM | POA: Diagnosis not present

## 2020-07-16 DIAGNOSIS — H938X2 Other specified disorders of left ear: Secondary | ICD-10-CM | POA: Diagnosis not present

## 2021-03-02 ENCOUNTER — Other Ambulatory Visit: Payer: Self-pay | Admitting: Family Medicine

## 2021-03-04 ENCOUNTER — Other Ambulatory Visit: Payer: Self-pay

## 2021-03-04 ENCOUNTER — Encounter: Payer: Self-pay | Admitting: Family Medicine

## 2021-03-04 MED ORDER — FLUOXETINE HCL 40 MG PO CAPS: ORAL_CAPSULE | ORAL | 0 refills | Status: DC

## 2021-03-07 DIAGNOSIS — F419 Anxiety disorder, unspecified: Secondary | ICD-10-CM | POA: Diagnosis not present

## 2021-05-23 DIAGNOSIS — F419 Anxiety disorder, unspecified: Secondary | ICD-10-CM | POA: Diagnosis not present

## 2021-06-02 ENCOUNTER — Other Ambulatory Visit: Payer: Self-pay | Admitting: Family Medicine

## 2021-08-22 DIAGNOSIS — F419 Anxiety disorder, unspecified: Secondary | ICD-10-CM | POA: Diagnosis not present

## 2021-08-27 ENCOUNTER — Other Ambulatory Visit: Payer: Self-pay | Admitting: Family Medicine

## 2021-10-06 ENCOUNTER — Encounter: Payer: Self-pay | Admitting: *Deleted

## 2021-10-27 ENCOUNTER — Ambulatory Visit (INDEPENDENT_AMBULATORY_CARE_PROVIDER_SITE_OTHER): Payer: BC Managed Care – PPO | Admitting: Family Medicine

## 2021-10-27 ENCOUNTER — Encounter: Payer: Self-pay | Admitting: Family Medicine

## 2021-10-27 VITALS — BP 122/82 | HR 85 | Temp 97.3°F | Ht 76.0 in | Wt 248.2 lb

## 2021-10-27 DIAGNOSIS — Z0000 Encounter for general adult medical examination without abnormal findings: Secondary | ICD-10-CM | POA: Diagnosis not present

## 2021-10-27 DIAGNOSIS — E785 Hyperlipidemia, unspecified: Secondary | ICD-10-CM | POA: Diagnosis not present

## 2021-10-27 DIAGNOSIS — F411 Generalized anxiety disorder: Secondary | ICD-10-CM | POA: Diagnosis not present

## 2021-10-27 DIAGNOSIS — Z23 Encounter for immunization: Secondary | ICD-10-CM | POA: Diagnosis not present

## 2021-10-27 MED ORDER — FLUOXETINE HCL 40 MG PO CAPS: 40.0000 mg | ORAL_CAPSULE | Freq: Every day | ORAL | 3 refills | Status: DC

## 2021-10-27 NOTE — Progress Notes (Signed)
Phone: [phone]    Subjective:  Patient presents today for their annual physical. Chief complaint-noted.   See problem oriented charting- ROS- full  review of systems was completed and negative  Per full ROS sheet completed by patient  The following were reviewed and entered/updated in epic: Past Medical History:  Diagnosis Date   COVID-19 06/10/2020   Healthy adult    Leg fracture    L- age 36   Patient Active Problem List   Diagnosis Date Noted   Family history of melanoma 08/28/2014    Priority: Low   GAD (generalized anxiety disorder) 09/09/2017   Allergic to insect stings 06/22/2016   Past Surgical History:  Procedure Laterality Date   none      Family History  Problem Relation Age of Onset   Melanoma Mother        x2    Medications- reviewed and updated Current Outpatient Medications  Medication Sig Dispense Refill   FLUoxetine (PROZAC) 40 MG capsule Take 1 capsule (40 mg total) by mouth daily. 90 capsule 3   No current facility-administered medications for this visit.    Allergies-reviewed and updated No Known Allergies  Social History   Social History Narrative   Family: Married. 08/02/14 son Simonne Come. 11/19 daughter Froid.       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 Oropharynx normal Neck: no thyromegaly CV: RRR no murmurs rubs or gallops Lungs: CTAB no crackles, wheeze, rhonchi Abdomen: soft/nontender/nondistended/normal bowel sounds. No rebound or guarding.  Ext: no edema Skin: warm, dry Neuro: grossly normal, moves all  extremities, PERRLA     Assessment and Plan:  36 y.o. male presenting for annual physical.  Health Maintenance counseling: 1. Anticipatory guidance: Patient counseled regarding regular dental exams -q6 months, eye exams - no vision issues,  avoiding smoking and second hand smoke , limiting alcohol to 2 beverages per day- needs to cut down some around 15-20 a week, no illicit drugs.   2. Risk factor reduction:  Advised patient of need for regular exercise and diet rich and fruits and vegetables to reduce risk of heart attack and stroke.  Exercise- adult league soccer and runs when he can but inconsistent.  Diet/weight management-has done well with healthy lunches at work, trying to add a healthy breakfast- struggles on the weekends. .  Wt Readings from Last 3 Encounters:  10/27/21 248 lb 3.2 oz (112.6 kg)  04/04/20 229 lb (103.9 kg)  02/10/19 215 lb (97.5 kg)  3. Immunizations/screenings/ancillary studies- wanting to hold off on covid  Immunization History  Administered Date(s) Administered   Influenza,inj,Quad PF,6+ Mos 10/19/2018, 10/12/2019, 10/27/2021   Influenza-Unspecified 11/27/2014, 11/12/2017, 10/12/2019   PFIZER(Purple Top)SARS-COV-2 Vaccination 03/31/2019, 04/25/2019, 12/20/2019   Tdap 08/28/2014  4. Prostate cancer screening- no family history, start at age 18  5. Colon cancer screening - no family history, start at age 71 6. Skin cancer screening/prevention-  yearly in past with family  history of melanoma. wife monitors for him-states released unless concerns. advised regular sunscreen use. Denies worrisome, changing, or new skin lesions.  7. Testicular cancer screening- advised monthly self exams  8. STD screening- patient opts out as only active with wife 62. Never smoker-    Status of chronic or acute concerns   # GAD S:Medication: fluoxetine 40 mg . No SI -able to navigate anxiety better when does come    04/04/2020    8:30 AM 02/10/2019    2:45 PM 12/06/2017    4:18  PM 09/09/2017    4:26 PM  GAD 7 : Generalized Anxiety Score  Nervous, Anxious, on Edge 0 0 2 2  Control/stop worrying 0 0 0 2  Worry too much - different things 1 0 1 2  Trouble relaxing 0 0 1 2  Restless 0 0 0 2  Easily annoyed or irritable 0 0 1 1  Afraid - awful might happen 0 0 0 1  Total GAD 7 Score 1 0 5 12  Anxiety Difficulty Not difficult at all Not difficult at all Somewhat difficult Somewhat difficult  A/P: doing well- continue current medicines. Prefers not to try to reduce   #hyperlipidemia S: Medication:none -did a follow up screening with work-  LDL up to 195 on a fingerprick Lab Results  Component Value Date   CHOL 224 (H) 04/04/2020   HDL 60.10 04/04/2020   LDLCALC 142 (H) 04/04/2020   TRIG 109.0 04/04/2020   CHOLHDL 4 04/04/2020   A/P: update lipid panel - hoping improvement compared to work labs- discussed working on lifestyle  Recommended follow up: Return in about 1 year (around 10/28/2022) for physical or sooner if needed.Schedule b4 you leave.  Lab/Order associations: fasting   ICD-10-CM   1. Preventative health care  Z00.00 CBC with Differential/Platelet    Comprehensive metabolic panel    Lipid panel    2. Need for immunization against influenza  Z23 Flu Vaccine QUAD 42mo+IM (Fluarix, Fluzone & Alfiuria Quad PF)    3. Hyperlipidemia, unspecified hyperlipidemia type  E78.5 Lipid panel    4. GAD (generalized anxiety disorder)  F41.1 CBC with Differential/Platelet    Comprehensive metabolic panel      Meds ordered this encounter  Medications   FLUoxetine (PROZAC) 40 MG capsule    Sig: Take 1 capsule (40 mg total) by mouth daily.    Dispense:  90 capsule    Refill:  3    Return precautions advised.   Garret Reddish, MD

## 2021-10-27 NOTE — Patient Instructions (Addendum)
Please stop by lab before you go If you have mychart- we will send your results within 3 business days of [place] receiving them.  If you do not have mychart- we will call you about results within 5 business days of [place] receiving them.  *please also note that you will see labs on mychart as soon as they post. I will later go in and write notes on them- will say "notes from Dr. Yong Channel"   Your goals Work on weight loss Cut down alcohol max 14 per week Regular exercise Improve cholesterol  Recommended follow up: Return in about 1 year (around 10/28/2022) for physical or sooner if needed.Schedule b4 you leave.

## 2021-10-28 LAB — COMPREHENSIVE METABOLIC PANEL
ALT: 19 U/L (ref 0–53)
AST: 24 U/L (ref 0–37)
Albumin: 4.6 g/dL (ref 3.5–5.2)
Alkaline Phosphatase: 44 U/L (ref 39–117)
BUN: 16 mg/dL (ref 6–23)
CO2: 29 mEq/L (ref 19–32)
Calcium: 9.9 mg/dL (ref 8.4–10.5)
Chloride: 103 mEq/L (ref 96–112)
Creatinine, Ser: 0.97 mg/dL (ref 0.40–1.50)
GFR: 99.69 mL/min (ref >60.00)
Glucose, Bld: 91 mg/dL (ref 70–99)
Potassium: 4.5 mEq/L (ref 3.5–5.1)
Sodium: 139 mEq/L (ref 135–145)
Total Bilirubin: 0.4 mg/dL (ref 0.2–1.2)
Total Protein: 7.3 g/dL (ref 6.0–8.3)

## 2021-10-28 LAB — CBC WITH DIFFERENTIAL/PLATELET
Basophils Absolute: 0.1 10*3/uL (ref 0.0–0.1)
Basophils Relative: 0.8 % (ref 0.0–3.0)
Eosinophils Absolute: 0.2 10*3/uL (ref 0.0–0.7)
Eosinophils Relative: 2 % (ref 0.0–5.0)
HCT: 46.7 % (ref 39.0–52.0)
Hemoglobin: 15.3 g/dL (ref 13.0–17.0)
Lymphocytes Relative: 33.6 % (ref 12.0–46.0)
Lymphs Abs: 3.1 10*3/uL (ref 0.7–4.0)
MCHC: 32.9 g/dL (ref 30.0–36.0)
MCV: 96.2 fl (ref 78.0–100.0)
Monocytes Absolute: 0.8 10*3/uL (ref 0.1–1.0)
Monocytes Relative: 9.2 % (ref 3.0–12.0)
Neutro Abs: 5 10*3/uL (ref 1.4–7.7)
Neutrophils Relative %: 54.4 % (ref 43.0–77.0)
Platelets: 244 10*3/uL (ref 150.0–400.0)
RBC: 4.85 Mil/uL (ref 4.22–5.81)
RDW: 13.3 % (ref 11.5–15.5)
WBC: 9.3 10*3/uL (ref 4.0–10.5)

## 2021-10-28 LAB — LIPID PANEL
Cholesterol: 235 mg/dL — ABNORMAL HIGH (ref 0–200)
HDL: 61.7 mg/dL (ref >39.00)
LDL Cholesterol: 149 mg/dL — ABNORMAL HIGH (ref 0–99)
NonHDL: 173.31
Total CHOL/HDL Ratio: 4
Triglycerides: 122 mg/dL (ref 0.0–149.0)
VLDL: 24.4 mg/dL (ref 0.0–40.0)

## 2021-12-19 DIAGNOSIS — F419 Anxiety disorder, unspecified: Secondary | ICD-10-CM | POA: Diagnosis not present

## 2022-04-03 DIAGNOSIS — F419 Anxiety disorder, unspecified: Secondary | ICD-10-CM | POA: Diagnosis not present

## 2022-07-10 DIAGNOSIS — F419 Anxiety disorder, unspecified: Secondary | ICD-10-CM | POA: Diagnosis not present

## 2022-09-07 DIAGNOSIS — X58XXXA Exposure to other specified factors, initial encounter: Secondary | ICD-10-CM | POA: Diagnosis not present

## 2022-09-07 DIAGNOSIS — S61211A Laceration without foreign body of left index finger without damage to nail, initial encounter: Secondary | ICD-10-CM | POA: Diagnosis not present

## 2022-09-07 DIAGNOSIS — Z23 Encounter for immunization: Secondary | ICD-10-CM | POA: Diagnosis not present

## 2022-10-23 DIAGNOSIS — F419 Anxiety disorder, unspecified: Secondary | ICD-10-CM | POA: Diagnosis not present

## 2022-11-21 ENCOUNTER — Other Ambulatory Visit: Payer: Self-pay | Admitting: Family Medicine

## 2023-01-01 DIAGNOSIS — F419 Anxiety disorder, unspecified: Secondary | ICD-10-CM | POA: Diagnosis not present

## 2023-02-10 ENCOUNTER — Encounter: Payer: Self-pay | Admitting: Family Medicine

## 2023-03-18 ENCOUNTER — Encounter: Payer: BC Managed Care – PPO | Admitting: Family Medicine

## 2023-03-26 DIAGNOSIS — F419 Anxiety disorder, unspecified: Secondary | ICD-10-CM | POA: Diagnosis not present

## 2023-04-16 ENCOUNTER — Encounter: Payer: Self-pay | Admitting: Family Medicine

## 2023-04-16 ENCOUNTER — Ambulatory Visit (INDEPENDENT_AMBULATORY_CARE_PROVIDER_SITE_OTHER): Payer: BC Managed Care – PPO | Admitting: Family Medicine

## 2023-04-16 VITALS — BP 100/70 | HR 72 | Temp 97.0°F | Ht 76.0 in | Wt 238.6 lb

## 2023-04-16 DIAGNOSIS — Z131 Encounter for screening for diabetes mellitus: Secondary | ICD-10-CM | POA: Diagnosis not present

## 2023-04-16 DIAGNOSIS — Z130 Encounter for screening for diseases of the blood and blood-forming organs and certain disorders involving the immune mechanism: Secondary | ICD-10-CM

## 2023-04-16 DIAGNOSIS — Z13220 Encounter for screening for lipoid disorders: Secondary | ICD-10-CM

## 2023-04-16 DIAGNOSIS — E663 Overweight: Secondary | ICD-10-CM

## 2023-04-16 DIAGNOSIS — Z0000 Encounter for general adult medical examination without abnormal findings: Secondary | ICD-10-CM

## 2023-04-16 MED ORDER — FLUOXETINE HCL 40 MG PO CAPS: 40.0000 mg | ORAL_CAPSULE | Freq: Every day | ORAL | 3 refills | Status: DC

## 2023-04-16 NOTE — Patient Instructions (Addendum)
 Please stop by lab before you go If you have mychart- we will send your results within 3 business days of [place] receiving them.  If you do not have mychart- we will call you about results within 5 business days of [place] receiving them.  *please also note that you will see labs on mychart as soon as they post. I will later go in and write notes on them- will say "notes from Dr. Durene Cal"   Recommended follow up: Return in about 1 year (around 04/15/2024) for physical or sooner if needed.Schedule b4 you leave.

## 2023-04-16 NOTE — Progress Notes (Signed)
 Phone: [phone]    Subjective:  Patient presents today for their annual physical. Chief complaint-noted.   See problem oriented charting- ROS- full  review of systems was completed and negative  Per full ROS sheet completed by patient  The following were reviewed and entered/updated in epic: Past Medical History:  Diagnosis Date   COVID-19 06/10/2020   Healthy adult    Leg fracture    L- age 38   Patient Active Problem List   Diagnosis Date Noted   Family history of melanoma 08/28/2014    Priority: Low   GAD (generalized anxiety disorder) 09/09/2017   Allergic to insect stings 06/22/2016   Past Surgical History:  Procedure Laterality Date   none      Family History  Problem Relation Age of Onset   Melanoma Mother        x2   Benign prostatic hyperplasia Father        reports prostatectomy early 23s- concern was cancer but none found   Healthy Sister    Diabetes Maternal Grandmother        overweight    Medications- reviewed and updated Current Outpatient Medications  Medication Sig Dispense Refill   FLUoxetine (PROZAC) 40 MG capsule TAKE 1 CAPSULE(40 MG) BY MOUTH DAILY 90 capsule 3   No current facility-administered medications for this visit.    Allergies-reviewed and updated No Known Allergies  Social History   Social History Narrative   Family: Married. 08/02/14 son Simonne Come. 11/19 daughter [place].       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 Oropharynx normal Neck: no thyromegaly CV: RRR no murmurs rubs or gallops Lungs: CTAB no  crackles, wheeze, rhonchi Abdomen: soft/nontender/nondistended/normal bowel sounds. No rebound or guarding.  Ext: no edema Skin: warm, dry Neuro: grossly normal, moves all extremities, PERRLA    Assessment and Plan:  38 y.o. male presenting for annual physical.  Health Maintenance counseling: 1. Anticipatory guidance: Patient counseled regarding regular dental exams -q6 months, eye exams- no vision issues ,  avoiding smoking and second hand smoke , limiting alcohol to 2 beverages per day-15-20 a week previously- had encouraged cutting back and  has taken 5 weeks off completely and feels better-desires lower,  no illicit drugs.   2. Risk factor reduction:  Advised patient of need for regular exercise and diet rich and fruits and vegetables to reduce risk of heart attack and stroke.  Exercise- adult league soccer once a week (goalie though) and some running- doing more regularly- likes trail runs 2-3 days a week Diet/weight management-down 10 lbs from last physical- had worked on [employer] h- weekends tougher but reducing alcohol helps. Brings healthy lunch to work. Supportive work Solicitor. Has weight 185-190. He used to have under 200 weight goal. Goal now 215. Does not eat breakfast now thoug Wt Readings from Last 3 Encounters:  04/16/23 238 lb 9.6 oz (108.2 kg)  10/27/21 248 lb 3.2  oz (112.6 kg)  04/04/20 229 lb (103.9 kg)  3. Immunizations/screenings/ancillary studies- opts out COVID , otherwise up to date  Immunization History  Administered Date(s) Administered   Influenza,inj,Quad PF,6+ Mos 10/19/2018, 10/12/2019, 10/27/2021   Influenza-Unspecified 11/27/2014, 11/12/2017, 10/12/2019, 11/12/2022   PFIZER(Purple Top)SARS-COV-2 Vaccination 03/31/2019, 04/25/2019, 12/20/2019   Tdap 08/28/2014  4. Prostate cancer screening-  no family history, start at age 27 - some significant BPH history in dad with prostatectomy around 33  5. Colon cancer screening -  no family history, start at age  63 6. Skin cancer screening/prevention- was released from dermatology unless has skin changes- wife monitors with moms history melanoma. advised regular sunscreen use. Denies worrisome, changing, or new skin lesions.  7. Testicular cancer screening- advised monthly self exams  8. STD screening- patient opts out as only active with wife 9. Smoking associated screening- never smoker-   Status of chronic or acute concerns   # Anxiety S:Medication: fluoxetine 40 mg   Counseling: therapist once a quarter helpful A/P: doing well- continue current medications - prefers to continue   #screen hyperlipidemia S: Medication:none  Lab Results  Component Value Date   CHOL 235 (H) 10/27/2021   HDL 61.70 10/27/2021   LDLCALC 149 (H) 10/27/2021   TRIG 122.0 10/27/2021   CHOLHDL 4 10/27/2021   A/P: moderate elevations- monitor with labs today   Recommended follow up: Return in about 1 year (around 04/15/2024) for physical or sooner if needed.Schedule b4 you leave.  Lab/Order associations: fasting   ICD-10-CM   1. Preventative health care  Z00.00     2. Screening for hyperlipidemia  Z13.220     3. Screening for deficiency anemia  Z13.0     4. Screening for diabetes mellitus  Z13.1     5. Overweight  E66.3      No orders of the defined types were placed in this encounter.  Return precautions advised.  Tana Conch, MD

## 2023-04-17 LAB — COMPREHENSIVE METABOLIC PANEL WITH GFR
AG Ratio: 2 (calc) (ref 1.0–2.5)
ALT: 17 U/L (ref 9–46)
AST: 17 U/L (ref 10–40)
Albumin: 4.6 g/dL (ref 3.6–5.1)
Alkaline phosphatase (APISO): 40 U/L (ref 36–130)
BUN: 11 mg/dL (ref 7–25)
CO2: 25 mmol/L (ref 20–32)
Calcium: 10.1 mg/dL (ref 8.6–10.3)
Chloride: 106 mmol/L (ref 98–110)
Creat: 0.89 mg/dL (ref 0.60–1.26)
Globulin: 2.3 g/dL (ref 1.9–3.7)
Glucose, Bld: 77 mg/dL (ref 65–99)
Potassium: 4.5 mmol/L (ref 3.5–5.3)
Sodium: 141 mmol/L (ref 135–146)
Total Bilirubin: 1 mg/dL (ref 0.2–1.2)
Total Protein: 6.9 g/dL (ref 6.1–8.1)
eGFR: 112 mL/min/{1.73_m2} (ref >=60)

## 2023-04-17 LAB — CBC WITH DIFFERENTIAL/PLATELET
Absolute Lymphocytes: 2100 {cells}/uL (ref 850–3900)
Absolute Monocytes: 812 {cells}/uL (ref 200–950)
Basophils Absolute: 58 {cells}/uL (ref 0–200)
Basophils Relative: 1 %
Eosinophils Absolute: 93 {cells}/uL (ref 15–500)
Eosinophils Relative: 1.6 %
HCT: 47 % (ref 38.5–50.0)
Hemoglobin: 15.6 g/dL (ref 13.2–17.1)
MCH: 31.5 pg (ref 27.0–33.0)
MCHC: 33.2 g/dL (ref 32.0–36.0)
MCV: 94.8 fL (ref 80.0–100.0)
MPV: 12.1 fL (ref 7.5–12.5)
Monocytes Relative: 14 %
Neutro Abs: 2738 {cells}/uL (ref 1500–7800)
Neutrophils Relative %: 47.2 %
Platelets: 211 10*3/uL (ref 140–400)
RBC: 4.96 10*6/uL (ref 4.20–5.80)
RDW: 12.6 % (ref 11.0–15.0)
Total Lymphocyte: 36.2 %
WBC: 5.8 10*3/uL (ref 3.8–10.8)

## 2023-04-17 LAB — LIPID PANEL
Cholesterol: 230 mg/dL — ABNORMAL HIGH (ref <200)
HDL: 53 mg/dL (ref >=40)
LDL Cholesterol (Calc): 155 mg/dL — ABNORMAL HIGH
Non-HDL Cholesterol (Calc): 177 mg/dL — ABNORMAL HIGH (ref <130)
Total CHOL/HDL Ratio: 4.3 (calc) (ref <5.0)
Triglycerides: 103 mg/dL (ref <150)

## 2023-04-17 LAB — HEMOGLOBIN A1C
Hgb A1c MFr Bld: 5.7 %{Hb} — ABNORMAL HIGH (ref <5.7)
Mean Plasma Glucose: 117 mg/dL
eAG (mmol/L): 6.5 mmol/L

## 2023-04-19 ENCOUNTER — Encounter: Payer: Self-pay | Admitting: Family Medicine

## 2023-05-07 DIAGNOSIS — F419 Anxiety disorder, unspecified: Secondary | ICD-10-CM | POA: Diagnosis not present

## 2023-06-18 DIAGNOSIS — F419 Anxiety disorder, unspecified: Secondary | ICD-10-CM | POA: Diagnosis not present

## 2023-08-27 DIAGNOSIS — F419 Anxiety disorder, unspecified: Secondary | ICD-10-CM | POA: Diagnosis not present

## 2023-11-18 ENCOUNTER — Encounter: Payer: Self-pay | Admitting: Family Medicine

## 2023-12-03 DIAGNOSIS — F419 Anxiety disorder, unspecified: Secondary | ICD-10-CM | POA: Diagnosis not present

## 2024-02-14 ENCOUNTER — Telehealth: Payer: Self-pay | Admitting: Family Medicine

## 2024-02-14 ENCOUNTER — Other Ambulatory Visit: Payer: Self-pay

## 2024-02-14 MED ORDER — FLUOXETINE HCL 40 MG PO CAPS: 40.0000 mg | ORAL_CAPSULE | Freq: Every day | ORAL | 3 refills | Status: DC

## 2024-02-14 NOTE — Telephone Encounter (Signed)
 Refill sent. Patient scheduled for physical in April.

## 2024-02-16 ENCOUNTER — Other Ambulatory Visit: Payer: Self-pay

## 2024-02-16 MED ORDER — FLUOXETINE HCL 40 MG PO CAPS: 40.0000 mg | ORAL_CAPSULE | Freq: Every day | ORAL | 3 refills | Status: AC

## 2024-04-21 ENCOUNTER — Encounter: Admitting: Family Medicine
